# Patient Record
Sex: Male | Born: 2000 | Hispanic: Yes | Marital: Single | Smoking: Never smoker
Health system: Southern US, Community
[De-identification: ages and names within clinical notes are randomized; demographics above are authoritative.]

---
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
## 2019-10-12 DIAGNOSIS — U071 COVID-19: Secondary | ICD-10-CM | POA: Diagnosis not present

## 2019-10-12 DIAGNOSIS — Z20822 Contact with and (suspected) exposure to covid-19: Secondary | ICD-10-CM | POA: Diagnosis not present

## 2019-11-01 DIAGNOSIS — J029 Acute pharyngitis, unspecified: Secondary | ICD-10-CM | POA: Diagnosis not present

## 2020-03-09 DIAGNOSIS — L210 Seborrhea capitis: Secondary | ICD-10-CM | POA: Diagnosis not present

## 2020-03-09 DIAGNOSIS — K9049 Malabsorption due to intolerance, not elsewhere classified: Secondary | ICD-10-CM | POA: Diagnosis not present

## 2020-06-15 ENCOUNTER — Other Ambulatory Visit: Payer: Self-pay

## 2020-06-15 ENCOUNTER — Ambulatory Visit (INDEPENDENT_AMBULATORY_CARE_PROVIDER_SITE_OTHER): Payer: BC Managed Care – PPO | Admitting: Sports Medicine

## 2020-06-15 DIAGNOSIS — M7651 Patellar tendinitis, right knee: Secondary | ICD-10-CM | POA: Insufficient documentation

## 2020-06-15 MED ORDER — MELOXICAM 15 MG PO TABS: ORAL_TABLET | ORAL | 3 refills | Status: DC

## 2020-06-15 NOTE — Progress Notes (Signed)
    Procedures performed today:    None.  Independent interpretation of notes and tests performed by another provider:   None.  Brief History, Exam, Impression, and Recommendations:    Patellar tendinitis, right knee This is a pleasant 21 year old male, he was a track and Location manager, now works in Network engineer, he has noted increasing pain with certain activities at work such as crawling and kneeling, he has started to wear a knee pad which tends to help. On exam he has pain at the distal pole of the patella consistent with patellar tendinitis. Physes are likely close with unlikely this represents Sinding-Larsen-Johansson syndrome. Adding a patellar strap, he will do decline/eccentric rehabilitation exercises in the gym, patellar tendinitis exercises given as well. Meloxicam for 2 weeks. I also asked him to be very cognizant of ergonomics at work. Return to see me in a month, we will discuss advanced imaging and biologic type treatment if no better at the follow-up.    ___________________________________________ Ihor Austin. Benjamin Stain, M.D., ABFM., CAQSM. Primary Care and Sports Medicine [facility]  Adjunct Instructor of Family Medicine  [employer]

## 2020-06-15 NOTE — Assessment & Plan Note (Signed)
This is a pleasant 20 year old male, he was a track and Location manager, now works in Network engineer, he has noted increasing pain with certain activities at work such as crawling and kneeling, he has started to wear a knee pad which tends to help. On exam he has pain at the distal pole of the patella consistent with patellar tendinitis. Physes are likely close with unlikely this represents Sinding-Larsen-Johansson syndrome. Adding a patellar strap, he will do decline/eccentric rehabilitation exercises in the gym, patellar tendinitis exercises given as well. Meloxicam for 2 weeks. I also asked him to be very cognizant of ergonomics at work. Return to see me in a month, we will discuss advanced imaging and biologic type treatment if no better at the follow-up.

## 2020-06-23 ENCOUNTER — Other Ambulatory Visit: Payer: Self-pay

## 2020-06-23 DIAGNOSIS — M7651 Patellar tendinitis, right knee: Secondary | ICD-10-CM

## 2020-06-23 MED ORDER — MELOXICAM 15 MG PO TABS: ORAL_TABLET | ORAL | 3 refills | Status: DC

## 2020-06-23 NOTE — Progress Notes (Signed)
Mother called stating that their new insurance requires them to use [facility]. [facility].

## 2020-07-13 ENCOUNTER — Ambulatory Visit: Payer: BC Managed Care – PPO | Admitting: Sports Medicine

## 2020-08-13 ENCOUNTER — Observation Stay (HOSPITAL_COMMUNITY)
Admission: EM | Admit: 2020-08-13 | Discharge: 2020-08-15 | Disposition: A | Discharge status: Home / Self Care | Payer: BC Managed Care – PPO | Attending: Orthopedic Surgery | Admitting: Orthopedic Surgery

## 2020-08-13 ENCOUNTER — Other Ambulatory Visit: Payer: Self-pay

## 2020-08-13 ENCOUNTER — Encounter (HOSPITAL_COMMUNITY): Payer: Self-pay

## 2020-08-13 DIAGNOSIS — S81811A Laceration without foreign body, right lower leg, initial encounter: Secondary | ICD-10-CM | POA: Diagnosis not present

## 2020-08-13 DIAGNOSIS — S82831A Other fracture of upper and lower end of right fibula, initial encounter for closed fracture: Secondary | ICD-10-CM | POA: Diagnosis not present

## 2020-08-13 DIAGNOSIS — S8991XA Unspecified injury of right lower leg, initial encounter: Secondary | ICD-10-CM | POA: Diagnosis not present

## 2020-08-13 DIAGNOSIS — S82201B Unspecified fracture of shaft of right tibia, initial encounter for open fracture type I or II: Secondary | ICD-10-CM | POA: Diagnosis present

## 2020-08-13 DIAGNOSIS — Z23 Encounter for immunization: Secondary | ICD-10-CM | POA: Diagnosis not present

## 2020-08-13 DIAGNOSIS — S82434B Nondisplaced oblique fracture of shaft of right fibula, initial encounter for open fracture type I or II: Secondary | ICD-10-CM

## 2020-08-13 DIAGNOSIS — S86321A Laceration of muscle(s) and tendon(s) of peroneal muscle group at lower leg level, right leg, initial encounter: Secondary | ICD-10-CM | POA: Insufficient documentation

## 2020-08-13 DIAGNOSIS — S8411XA Injury of peroneal nerve at lower leg level, right leg, initial encounter: Secondary | ICD-10-CM | POA: Diagnosis not present

## 2020-08-13 DIAGNOSIS — S82202B Unspecified fracture of shaft of left tibia, initial encounter for open fracture type I or II: Secondary | ICD-10-CM | POA: Diagnosis present

## 2020-08-13 DIAGNOSIS — S82231B Displaced oblique fracture of shaft of right tibia, initial encounter for open fracture type I or II: Secondary | ICD-10-CM | POA: Diagnosis not present

## 2020-08-13 DIAGNOSIS — W268XXA Contact with other sharp object(s), not elsewhere classified, initial encounter: Secondary | ICD-10-CM | POA: Diagnosis not present

## 2020-08-13 DIAGNOSIS — S82401B Unspecified fracture of shaft of right fibula, initial encounter for open fracture type I or II: Principal | ICD-10-CM | POA: Insufficient documentation

## 2020-08-13 DIAGNOSIS — Z20822 Contact with and (suspected) exposure to covid-19: Secondary | ICD-10-CM | POA: Diagnosis not present

## 2020-08-13 NOTE — ED Triage Notes (Signed)
Pt presents from home, states he was cutting wood with an axe and it slipped and cut his anterior R lower leg. Laceration is approximately 2-3in length

## 2020-08-13 NOTE — ED Notes (Signed)
Wound cleansed and re-dressed. 

## 2020-08-14 ENCOUNTER — Encounter (HOSPITAL_COMMUNITY): Payer: Self-pay

## 2020-08-14 ENCOUNTER — Encounter (HOSPITAL_COMMUNITY)
Admission: EM | Disposition: A | Discharge status: Home / Self Care | Payer: Self-pay | Source: Home / Self Care | Attending: Emergency Medicine

## 2020-08-14 ENCOUNTER — Emergency Department (HOSPITAL_COMMUNITY): Payer: BC Managed Care – PPO | Admitting: Certified Registered Nurse Anesthetist

## 2020-08-14 ENCOUNTER — Emergency Department (HOSPITAL_COMMUNITY): Payer: BC Managed Care – PPO

## 2020-08-14 DIAGNOSIS — S82401B Unspecified fracture of shaft of right fibula, initial encounter for open fracture type I or II: Secondary | ICD-10-CM | POA: Diagnosis not present

## 2020-08-14 DIAGNOSIS — S82201B Unspecified fracture of shaft of right tibia, initial encounter for open fracture type I or II: Secondary | ICD-10-CM | POA: Diagnosis present

## 2020-08-14 DIAGNOSIS — S82831B Other fracture of upper and lower end of right fibula, initial encounter for open fracture type I or II: Secondary | ICD-10-CM | POA: Diagnosis not present

## 2020-08-14 DIAGNOSIS — S82831A Other fracture of upper and lower end of right fibula, initial encounter for closed fracture: Secondary | ICD-10-CM | POA: Diagnosis not present

## 2020-08-14 DIAGNOSIS — M7651 Patellar tendinitis, right knee: Secondary | ICD-10-CM | POA: Diagnosis not present

## 2020-08-14 DIAGNOSIS — S86121A Laceration of other muscle(s) and tendon(s) of posterior muscle group at lower leg level, right leg, initial encounter: Secondary | ICD-10-CM | POA: Diagnosis not present

## 2020-08-14 DIAGNOSIS — S82202B Unspecified fracture of shaft of left tibia, initial encounter for open fracture type I or II: Secondary | ICD-10-CM | POA: Diagnosis present

## 2020-08-14 DIAGNOSIS — S81811A Laceration without foreign body, right lower leg, initial encounter: Secondary | ICD-10-CM | POA: Diagnosis not present

## 2020-08-14 HISTORY — PX: ORIF ANKLE FRACTURE: SHX5408

## 2020-08-14 HISTORY — PX: I & D EXTREMITY: SHX5045

## 2020-08-14 LAB — CBC WITH DIFFERENTIAL/PLATELET
Abs Immature Granulocytes: 0.04 10*3/uL (ref 0.00–0.07)
Basophils Absolute: 0 10*3/uL (ref 0.0–0.1)
Basophils Relative: 0 %
Eosinophils Absolute: 0 10*3/uL (ref 0.0–0.5)
Eosinophils Relative: 0 %
HCT: 43.1 % (ref 39.0–52.0)
Hemoglobin: 15 g/dL (ref 13.0–17.0)
Immature Granulocytes: 0 %
Lymphocytes Relative: 9 %
Lymphs Abs: 1.2 10*3/uL (ref 0.7–4.0)
MCH: 30.9 pg (ref 26.0–34.0)
MCHC: 34.8 g/dL (ref 30.0–36.0)
MCV: 88.9 fL (ref 80.0–100.0)
Monocytes Absolute: 0.8 10*3/uL (ref 0.1–1.0)
Monocytes Relative: 6 %
Neutro Abs: 10.7 10*3/uL — ABNORMAL HIGH (ref 1.7–7.7)
Neutrophils Relative %: 85 %
Platelets: 256 10*3/uL (ref 150–400)
RBC: 4.85 MIL/uL (ref 4.22–5.81)
RDW: 12.7 % (ref 11.5–15.5)
WBC: 12.7 10*3/uL — ABNORMAL HIGH (ref 4.0–10.5)
nRBC: 0 % (ref 0.0–0.2)

## 2020-08-14 LAB — BASIC METABOLIC PANEL
Anion gap: 10 (ref 5–15)
BUN: 21 mg/dL — ABNORMAL HIGH (ref 6–20)
CO2: 26 mmol/L (ref 22–32)
Calcium: 9.2 mg/dL (ref 8.9–10.3)
Chloride: 103 mmol/L (ref 98–111)
Creatinine, Ser: 1.12 mg/dL (ref 0.61–1.24)
GFR, Estimated: >60 mL/min (ref >60)
Glucose, Bld: 128 mg/dL — ABNORMAL HIGH (ref 70–99)
Potassium: 3.8 mmol/L (ref 3.5–5.1)
Sodium: 139 mmol/L (ref 135–145)

## 2020-08-14 LAB — RESP PANEL BY RT-PCR (FLU A&B, COVID) ARPGX2
Influenza A by PCR: NEGATIVE
Influenza B by PCR: NEGATIVE
SARS Coronavirus 2 by RT PCR: NEGATIVE

## 2020-08-14 SURGERY — IRRIGATION AND DEBRIDEMENT EXTREMITY
Anesthesia: General | Site: Ankle | Laterality: Right

## 2020-08-14 MED ORDER — ACETAMINOPHEN 10 MG/ML IV SOLN
INTRAVENOUS | Status: AC
  Filled 2020-08-14: qty 100

## 2020-08-14 MED ORDER — CEFAZOLIN SODIUM-DEXTROSE 2-4 GM/100ML-% IV SOLN
2.0000 g | INTRAVENOUS | Status: AC
  Administered 2020-08-14: 2 g via INTRAVENOUS

## 2020-08-14 MED ORDER — VANCOMYCIN HCL 1000 MG IV SOLR
INTRAVENOUS | Status: DC | PRN
  Administered 2020-08-14: 1000 mg

## 2020-08-14 MED ORDER — POVIDONE-IODINE 10 % EX SWAB
2.0000 "application " | Freq: Once | CUTANEOUS | Status: AC
  Administered 2020-08-14: 2 via TOPICAL

## 2020-08-14 MED ORDER — DOCUSATE SODIUM 100 MG PO CAPS
100.0000 mg | ORAL_CAPSULE | Freq: Two times a day (BID) | ORAL | Status: DC
  Administered 2020-08-14 (×2): 100 mg via ORAL
  Filled 2020-08-14 (×2): qty 1

## 2020-08-14 MED ORDER — LIDOCAINE 2% (20 MG/ML) 5 ML SYRINGE
INTRAMUSCULAR | Status: DC | PRN
  Administered 2020-08-14: 60 mg via INTRAVENOUS

## 2020-08-14 MED ORDER — HYDROCODONE-ACETAMINOPHEN 5-325 MG PO TABS: 1.0000 | ORAL_TABLET | ORAL | Status: DC | PRN

## 2020-08-14 MED ORDER — ACETAMINOPHEN 10 MG/ML IV SOLN
1000.0000 mg | Freq: Once | INTRAVENOUS | Status: DC | PRN
  Administered 2020-08-14: 1000 mg via INTRAVENOUS

## 2020-08-14 MED ORDER — OXYCODONE HCL 5 MG PO TABS: 5.0000 mg | ORAL_TABLET | Freq: Once | ORAL | Status: DC | PRN

## 2020-08-14 MED ORDER — PROPOFOL 10 MG/ML IV BOLUS
INTRAVENOUS | Status: DC | PRN
  Administered 2020-08-14: 200 mg via INTRAVENOUS

## 2020-08-14 MED ORDER — FENTANYL CITRATE (PF) 250 MCG/5ML IJ SOLN
INTRAMUSCULAR | Status: DC | PRN
  Administered 2020-08-14: 150 ug via INTRAVENOUS

## 2020-08-14 MED ORDER — NAPROXEN 250 MG PO TABS
250.0000 mg | ORAL_TABLET | Freq: Two times a day (BID) | ORAL | Status: DC
  Administered 2020-08-14 – 2020-08-15 (×2): 250 mg via ORAL
  Filled 2020-08-14 (×2): qty 1

## 2020-08-14 MED ORDER — KETOROLAC TROMETHAMINE 30 MG/ML IJ SOLN: 30.0000 mg | Freq: Once | INTRAMUSCULAR | Status: AC

## 2020-08-14 MED ORDER — AMISULPRIDE (ANTIEMETIC) 5 MG/2ML IV SOLN: 10.0000 mg | Freq: Once | INTRAVENOUS | Status: DC | PRN

## 2020-08-14 MED ORDER — SODIUM CHLORIDE 0.9 % IV SOLN: INTRAVENOUS | Status: DC

## 2020-08-14 MED ORDER — DEXAMETHASONE SODIUM PHOSPHATE 10 MG/ML IJ SOLN
INTRAMUSCULAR | Status: DC | PRN
  Administered 2020-08-14: 8 mg via INTRAVENOUS

## 2020-08-14 MED ORDER — BISACODYL 10 MG RE SUPP: 10.0000 mg | Freq: Every day | RECTAL | Status: DC | PRN

## 2020-08-14 MED ORDER — ONDANSETRON HCL 4 MG/2ML IJ SOLN
INTRAMUSCULAR | Status: DC | PRN
  Administered 2020-08-14: 4 mg via INTRAVENOUS

## 2020-08-14 MED ORDER — SENNA 8.6 MG PO TABS
1.0000 | ORAL_TABLET | Freq: Two times a day (BID) | ORAL | Status: DC
  Administered 2020-08-14 (×2): 8.6 mg via ORAL
  Filled 2020-08-14 (×2): qty 1

## 2020-08-14 MED ORDER — KETOROLAC TROMETHAMINE 30 MG/ML IJ SOLN
INTRAMUSCULAR | Status: AC
  Administered 2020-08-14: 30 mg via INTRAVENOUS
  Filled 2020-08-14: qty 1

## 2020-08-14 MED ORDER — MIDAZOLAM HCL 5 MG/5ML IJ SOLN
INTRAMUSCULAR | Status: DC | PRN
  Administered 2020-08-14: 2 mg via INTRAVENOUS

## 2020-08-14 MED ORDER — MORPHINE SULFATE (PF) 2 MG/ML IV SOLN
0.5000 mg | INTRAVENOUS | Status: DC | PRN
  Administered 2020-08-14 – 2020-08-15 (×3): 1 mg via INTRAVENOUS
  Filled 2020-08-14 (×3): qty 1

## 2020-08-14 MED ORDER — CEFAZOLIN SODIUM-DEXTROSE 2-4 GM/100ML-% IV SOLN
INTRAVENOUS | Status: AC
  Filled 2020-08-14: qty 100

## 2020-08-14 MED ORDER — MAGNESIUM CITRATE PO SOLN: 1.0000 | Freq: Once | ORAL | Status: DC | PRN

## 2020-08-14 MED ORDER — HYDROMORPHONE HCL 1 MG/ML IJ SOLN
0.5000 mg | Freq: Once | INTRAMUSCULAR | Status: AC | PRN
  Administered 2020-08-14: 0.5 mg via INTRAVENOUS

## 2020-08-14 MED ORDER — ARTIFICIAL TEARS OPHTHALMIC OINT
TOPICAL_OINTMENT | OPHTHALMIC | Status: AC
  Filled 2020-08-14: qty 3.5

## 2020-08-14 MED ORDER — CEFAZOLIN SODIUM-DEXTROSE 1-4 GM/50ML-% IV SOLN
1.0000 g | Freq: Once | INTRAVENOUS | Status: AC
  Administered 2020-08-14: 1 g via INTRAVENOUS
  Filled 2020-08-14: qty 50

## 2020-08-14 MED ORDER — FENTANYL CITRATE (PF) 100 MCG/2ML IJ SOLN
INTRAMUSCULAR | Status: DC | PRN
  Administered 2020-08-14 (×3): 50 ug via INTRAVENOUS

## 2020-08-14 MED ORDER — PROPOFOL 10 MG/ML IV BOLUS
INTRAVENOUS | Status: AC
  Filled 2020-08-14: qty 20

## 2020-08-14 MED ORDER — ONDANSETRON HCL 4 MG/2ML IJ SOLN: 4.0000 mg | Freq: Four times a day (QID) | INTRAMUSCULAR | Status: DC | PRN

## 2020-08-14 MED ORDER — FENTANYL CITRATE (PF) 100 MCG/2ML IJ SOLN
25.0000 ug | INTRAMUSCULAR | Status: DC | PRN
  Administered 2020-08-14 (×2): 50 ug via INTRAVENOUS

## 2020-08-14 MED ORDER — STERILE WATER FOR INJECTION IJ SOLN
INTRAMUSCULAR | Status: AC
  Filled 2020-08-14: qty 20

## 2020-08-14 MED ORDER — ENOXAPARIN SODIUM 40 MG/0.4ML IJ SOSY
40.0000 mg | PREFILLED_SYRINGE | INTRAMUSCULAR | Status: DC
  Administered 2020-08-15: 40 mg via SUBCUTANEOUS
  Filled 2020-08-14: qty 0.4

## 2020-08-14 MED ORDER — POLYETHYLENE GLYCOL 3350 17 G PO PACK: 17.0000 g | PACK | Freq: Every day | ORAL | Status: DC | PRN

## 2020-08-14 MED ORDER — SUCCINYLCHOLINE CHLORIDE 20 MG/ML IJ SOLN
INTRAMUSCULAR | Status: DC | PRN
  Administered 2020-08-14: 60 mg via INTRAVENOUS

## 2020-08-14 MED ORDER — DEXAMETHASONE SODIUM PHOSPHATE 10 MG/ML IJ SOLN
INTRAMUSCULAR | Status: AC
  Filled 2020-08-14: qty 1

## 2020-08-14 MED ORDER — TETANUS-DIPHTH-ACELL PERTUSSIS 5-2.5-18.5 LF-MCG/0.5 IM SUSY
0.5000 mL | PREFILLED_SYRINGE | Freq: Once | INTRAMUSCULAR | Status: AC
  Administered 2020-08-14: 0.5 mL via INTRAMUSCULAR
  Filled 2020-08-14: qty 0.5

## 2020-08-14 MED ORDER — CHLORHEXIDINE GLUCONATE 4 % EX LIQD: 60.0000 mL | Freq: Once | CUTANEOUS | Status: DC

## 2020-08-14 MED ORDER — FENTANYL CITRATE (PF) 250 MCG/5ML IJ SOLN
INTRAMUSCULAR | Status: AC
  Filled 2020-08-14: qty 5

## 2020-08-14 MED ORDER — HYDROMORPHONE HCL 1 MG/ML IJ SOLN
INTRAMUSCULAR | Status: AC
  Filled 2020-08-14: qty 1

## 2020-08-14 MED ORDER — HYDROCODONE-ACETAMINOPHEN 7.5-325 MG PO TABS
1.0000 | ORAL_TABLET | ORAL | Status: DC | PRN
  Administered 2020-08-14 (×2): 1 via ORAL
  Administered 2020-08-14 – 2020-08-15 (×3): 2 via ORAL
  Filled 2020-08-14: qty 1
  Filled 2020-08-14: qty 2
  Filled 2020-08-14: qty 1
  Filled 2020-08-14 (×2): qty 2

## 2020-08-14 MED ORDER — LACTATED RINGERS IV SOLN: INTRAVENOUS | Status: DC | PRN

## 2020-08-14 MED ORDER — SODIUM CHLORIDE (PF) 0.9 % IJ SOLN
INTRAMUSCULAR | Status: AC
  Filled 2020-08-14: qty 50

## 2020-08-14 MED ORDER — LIDOCAINE 2% (20 MG/ML) 5 ML SYRINGE
INTRAMUSCULAR | Status: AC
  Filled 2020-08-14: qty 5

## 2020-08-14 MED ORDER — FENTANYL CITRATE (PF) 100 MCG/2ML IJ SOLN
100.0000 ug | Freq: Once | INTRAMUSCULAR | Status: AC
  Administered 2020-08-14: 100 ug via INTRAVENOUS
  Filled 2020-08-14: qty 2

## 2020-08-14 MED ORDER — ARTIFICIAL TEARS OPHTHALMIC OINT
TOPICAL_OINTMENT | OPHTHALMIC | Status: DC | PRN
  Administered 2020-08-14: 1 via OPHTHALMIC

## 2020-08-14 MED ORDER — SODIUM CHLORIDE 0.9 % IR SOLN
Status: DC | PRN
  Administered 2020-08-14: 3000 mL

## 2020-08-14 MED ORDER — VANCOMYCIN HCL 1000 MG IV SOLR
INTRAVENOUS | Status: AC
  Filled 2020-08-14: qty 1000

## 2020-08-14 MED ORDER — ONDANSETRON HCL 4 MG/2ML IJ SOLN
4.0000 mg | Freq: Once | INTRAMUSCULAR | Status: AC
  Administered 2020-08-14: 4 mg via INTRAVENOUS
  Filled 2020-08-14: qty 2

## 2020-08-14 MED ORDER — IOHEXOL 350 MG/ML SOLN
100.0000 mL | Freq: Once | INTRAVENOUS | Status: AC | PRN
  Administered 2020-08-14: 100 mL via INTRAVENOUS

## 2020-08-14 MED ORDER — MIDAZOLAM HCL 2 MG/2ML IJ SOLN
INTRAMUSCULAR | Status: AC
  Filled 2020-08-14: qty 2

## 2020-08-14 MED ORDER — FENTANYL CITRATE (PF) 100 MCG/2ML IJ SOLN
INTRAMUSCULAR | Status: AC
  Filled 2020-08-14: qty 2

## 2020-08-14 MED ORDER — ONDANSETRON HCL 4 MG PO TABS: 4.0000 mg | ORAL_TABLET | Freq: Four times a day (QID) | ORAL | Status: DC | PRN

## 2020-08-14 MED ORDER — CEFAZOLIN SODIUM-DEXTROSE 2-4 GM/100ML-% IV SOLN
2.0000 g | Freq: Three times a day (TID) | INTRAVENOUS | Status: AC
  Administered 2020-08-14 – 2020-08-15 (×3): 2 g via INTRAVENOUS
  Filled 2020-08-14 (×3): qty 100

## 2020-08-14 MED ORDER — OXYCODONE HCL 5 MG/5ML PO SOLN: 5.0000 mg | Freq: Once | ORAL | Status: DC | PRN

## 2020-08-14 MED ORDER — PROMETHAZINE HCL 25 MG/ML IJ SOLN: 6.2500 mg | INTRAMUSCULAR | Status: DC | PRN

## 2020-08-14 MED ORDER — FENTANYL CITRATE (PF) 100 MCG/2ML IJ SOLN
INTRAMUSCULAR | Status: AC
  Administered 2020-08-14: 50 ug via INTRAVENOUS
  Filled 2020-08-14: qty 2

## 2020-08-14 MED ORDER — ACETAMINOPHEN 325 MG PO TABS
325.0000 mg | ORAL_TABLET | Freq: Four times a day (QID) | ORAL | Status: DC | PRN
Start: 2020-08-15 — End: 2020-08-15

## 2020-08-14 SURGICAL SUPPLY — 75 items
ANCHOR SUT KEITH ABD SZ2 STR (SUTURE) IMPLANT
BAG ZIPLOCK 12X15 (MISCELLANEOUS) ×3 IMPLANT
BNDG COHESIVE 4X5 TAN STRL (GAUZE/BANDAGES/DRESSINGS) ×3 IMPLANT
BNDG COHESIVE 6X5 TAN STRL LF (GAUZE/BANDAGES/DRESSINGS) ×3 IMPLANT
BNDG CONFORM 3 STRL LF (GAUZE/BANDAGES/DRESSINGS) ×3 IMPLANT
BNDG ELASTIC 4X5.8 VLCR STR LF (GAUZE/BANDAGES/DRESSINGS) ×3 IMPLANT
BNDG ELASTIC 6X5.8 VLCR STR LF (GAUZE/BANDAGES/DRESSINGS) ×3 IMPLANT
BNDG ESMARK 4X9 LF (GAUZE/BANDAGES/DRESSINGS) ×3 IMPLANT
BNDG GAUZE ELAST 4 BULKY (GAUZE/BANDAGES/DRESSINGS) ×6 IMPLANT
COVER SURGICAL LIGHT HANDLE (MISCELLANEOUS) ×3 IMPLANT
COVER WAND RF STERILE (DRAPES) IMPLANT
CUFF TOURN SGL QUICK 34 (TOURNIQUET CUFF) ×1
CUFF TRNQT CYL 34X4.125X (TOURNIQUET CUFF) ×2 IMPLANT
DECANTER SPIKE VIAL GLASS SM (MISCELLANEOUS) ×3 IMPLANT
DRAPE C-ARM 42X120 X-RAY (DRAPES) IMPLANT
DRAPE EXTREMITY T 121X128X90 (DISPOSABLE) ×3 IMPLANT
DRAPE OEC MINIVIEW 54X84 (DRAPES) ×3 IMPLANT
DRAPE ORTHO SPLIT 77X108 STRL (DRAPES) ×2
DRAPE SHEET LG 3/4 BI-LAMINATE (DRAPES) ×3 IMPLANT
DRAPE SURG ORHT 6 SPLT 77X108 (DRAPES) ×4 IMPLANT
DRAPE U-SHAPE 47X51 STRL (DRAPES) ×3 IMPLANT
DRESSING AQUACEL AG SP 3.5X6 (GAUZE/BANDAGES/DRESSINGS) ×2 IMPLANT
DRSG ADAPTIC 3X8 NADH LF (GAUZE/BANDAGES/DRESSINGS) IMPLANT
DRSG AQUACEL AG SP 3.5X6 (GAUZE/BANDAGES/DRESSINGS) ×3
DRSG PAD ABDOMINAL 8X10 ST (GAUZE/BANDAGES/DRESSINGS) ×3 IMPLANT
DURAPREP 26ML APPLICATOR (WOUND CARE) ×3 IMPLANT
ELECT REM PT RETURN 15FT ADLT (MISCELLANEOUS) ×3 IMPLANT
FACESHIELD WRAPAROUND (MASK) ×3 IMPLANT
GAUZE PACKING IODOFORM 1X5 (PACKING) ×3 IMPLANT
GAUZE SPONGE 4X4 12PLY STRL (GAUZE/BANDAGES/DRESSINGS) ×3 IMPLANT
GLOVE SRG 8 PF TXTR STRL LF DI (GLOVE) ×2 IMPLANT
GLOVE SURG ENC MOIS LTX SZ8 (GLOVE) ×3 IMPLANT
GLOVE SURG LTX SZ8 (GLOVE) ×6 IMPLANT
GLOVE SURG ORTHO LTX SZ7.5 (GLOVE) ×3 IMPLANT
GLOVE SURG ORTHO LTX SZ8.5 (GLOVE) ×3 IMPLANT
GLOVE SURG UNDER LTX SZ8 (GLOVE) ×3 IMPLANT
GLOVE SURG UNDER POLY LF SZ8 (GLOVE) ×1
KIT BASIN OR (CUSTOM PROCEDURE TRAY) ×3 IMPLANT
KIT TURNOVER KIT A (KITS) ×3 IMPLANT
NEEDLE HYPO 22GX1.5 SAFETY (NEEDLE) ×3 IMPLANT
NEEDLE MAYO CATGUT SZ4 (NEEDLE) IMPLANT
NS IRRIG 1000ML POUR BTL (IV SOLUTION) ×3 IMPLANT
PACK ORTHO EXTREMITY (CUSTOM PROCEDURE TRAY) ×3 IMPLANT
PACK TOTAL JOINT (CUSTOM PROCEDURE TRAY) ×3 IMPLANT
PAD CAST 4YDX4 CTTN HI CHSV (CAST SUPPLIES) ×4 IMPLANT
PADDING CAST COTTON 4X4 STRL (CAST SUPPLIES) ×2
PADDING CAST COTTON 6X4 STRL (CAST SUPPLIES) ×3 IMPLANT
PADDING CAST SYN 6 (CAST SUPPLIES) ×1
PADDING CAST SYNTHETIC 4 (CAST SUPPLIES) ×1
PADDING CAST SYNTHETIC 4X4 STR (CAST SUPPLIES) ×2 IMPLANT
PADDING CAST SYNTHETIC 6X4 NS (CAST SUPPLIES) ×2 IMPLANT
PENCIL SMOKE EVACUATOR (MISCELLANEOUS) ×3 IMPLANT
PROTECTOR NERVE ULNAR (MISCELLANEOUS) ×3 IMPLANT
SPLINT PLASTER CAST XFAST 5X30 (CAST SUPPLIES) ×2 IMPLANT
SPLINT PLASTER XFAST SET 5X30 (CAST SUPPLIES) ×1
SPONGE LAP 18X18 RF (DISPOSABLE) ×3 IMPLANT
SPONGE LAP 4X18 RFD (DISPOSABLE) ×6 IMPLANT
STAPLER VISISTAT 35W (STAPLE) ×3 IMPLANT
STRIP CLOSURE SKIN 1/2X4 (GAUZE/BANDAGES/DRESSINGS) ×3 IMPLANT
SUCTION FRAZIER HANDLE 10FR (MISCELLANEOUS) ×1
SUCTION TUBE FRAZIER 10FR DISP (MISCELLANEOUS) ×2 IMPLANT
SUT ETHILON 2 0 PS N (SUTURE) ×9 IMPLANT
SUT MNCRL AB 4-0 PS2 18 (SUTURE) ×6 IMPLANT
SUT PROLENE 3 0 PS 2 (SUTURE) ×3 IMPLANT
SUT PROLENE 6 0 P 1 18 (SUTURE) ×3 IMPLANT
SUT VIC AB 0 CT1 27 (SUTURE) ×1
SUT VIC AB 0 CT1 27XBRD ANTBC (SUTURE) ×2 IMPLANT
SUT VIC AB 2-0 CT1 27 (SUTURE) ×2
SUT VIC AB 2-0 CT1 TAPERPNT 27 (SUTURE) ×4 IMPLANT
SUT VIC AB 3-0 PS2 18 (SUTURE) ×1
SUT VIC AB 3-0 PS2 18XBRD (SUTURE) ×2 IMPLANT
SWAB COLLECTION DEVICE MRSA (MISCELLANEOUS) IMPLANT
SWAB CULTURE ESWAB REG 1ML (MISCELLANEOUS) IMPLANT
SYR CONTROL 10ML LL (SYRINGE) ×3 IMPLANT
WATER STERILE IRR 1000ML POUR (IV SOLUTION) ×3 IMPLANT

## 2020-08-14 NOTE — ED Notes (Signed)
To OR

## 2020-08-14 NOTE — Anesthesia Procedure Notes (Signed)
Procedure Name: Intubation Date/Time: 08/14/2020 5:11 AM Performed by: Montel Clock, CRNA Pre-anesthesia Checklist: Patient identified, Emergency Drugs available, Suction available, Patient being monitored and Timeout performed Patient Re-evaluated:Patient Re-evaluated prior to induction Oxygen Delivery Method: Circle system utilized Preoxygenation: Pre-oxygenation with 100% oxygen Induction Type: IV induction and Rapid sequence Laryngoscope Size: Mac and 3 Grade View: Grade I Tube type: Oral Tube size: 7.5 mm Number of attempts: 1 Airway Equipment and Method: Stylet Placement Confirmation: ETT inserted through vocal cords under direct vision,  positive ETCO2 and breath sounds checked- equal and bilateral Secured at: 23 cm Tube secured with: Tape Dental Injury: Teeth and Oropharynx as per pre-operative assessment

## 2020-08-14 NOTE — ED Notes (Signed)
LBM: 1400, 08/13/20 Last urine void: 2330, 08/13/20 Last PO intake: 2000, 08/13/20

## 2020-08-14 NOTE — ED Provider Notes (Signed)
[facility]-EMERGENCY DEPT Provider Note: Lowella Dell, MD, FACEP  CSN: [id number] MRN: [id number] ARRIVAL: 08/13/20 at 2225 ROOM: WA10/WA10   CHIEF COMPLAINT  Extremity Laceration   HISTORY OF PRESENT ILLNESS  08/14/20 12:32 AM Ryan Hall is a 20 y.o. male who was cutting wood with an ax about 2 and half hours ago and slipped and cut his lateral right lower leg.  He has a gaping laceration at the site.  He is having pain at the site which he rates as an 8 out of 10, worse with attempted movement or palpation.  He has decreased sensation and paresthesias of his foot but not complete loss of sensation.  He is having difficulty moving his right ankle but he states he believes this is due to pain.  He states there was significant blood loss at the scene.  The wound is now hemostatic.   History reviewed. No pertinent past medical history.  History reviewed. No pertinent surgical history.  History reviewed. No pertinent family history.  Social History   Tobacco Use  . Smoking status: Never Smoker  . Smokeless tobacco: Never Used    Prior to Admission medications   Not on File    Allergies Patient has no known allergies.   REVIEW OF SYSTEMS  Negative except as noted here or in the History of Present Illness.   PHYSICAL EXAMINATION  Initial Vital Signs Blood pressure 135/76, pulse 70, temperature 98.4 F (36.9 C), temperature source Oral, resp. rate 17, height 5\' 7"  (1.702 m), weight 68 kg, SpO2 100 %.  Examination General: Well-developed, well-nourished male in no acute distress; appearance consistent with age of record HENT: normocephalic; atraumatic Eyes: pupils equal, round and reactive to light; extraocular muscles intact Neck: supple Heart: regular rate and rhythm Lungs: clear to auscultation bilaterally Abdomen: soft; nondistended; nontender; bowel sounds present Extremities: No deformity; full range of motion; DP and PT pulses +1 bilaterally; laceration of  right lateral lower leg, motor function of right foot intact, motor function of right ankle exam limited due to pain, altered sensation of right foot without complete insensitivity, capillary refill brisk distally:    Neurologic: Awake, alert and oriented; motor function intact in all extremities and symmetric; no facial droop Skin: Warm and dry Psychiatric: Normal mood and affect   RESULTS  Summary of this visit's results, reviewed and interpreted by myself:   EKG Interpretation  Date/Time:    Ventricular Rate:    PR Interval:    QRS Duration:   QT Interval:    QTC Calculation:   R Axis:     Text Interpretation:        Laboratory Studies: Results for orders placed or performed during the hospital encounter of 08/13/20 (from the past 24 hour(s))  CBC with Differential/Platelet     Status: Abnormal   Collection Time: 08/14/20 12:57 AM  Result Value Ref Range   WBC 12.7 (H) 4.0 - 10.5 K/uL   RBC 4.85 4.22 - 5.81 MIL/uL   Hemoglobin 15.0 13.0 - 17.0 g/dL   HCT 08/16/20 54.0 - 08.6 %   MCV 88.9 80.0 - 100.0 fL   MCH 30.9 26.0 - 34.0 pg   MCHC 34.8 30.0 - 36.0 g/dL   RDW 76.1 95.0 - 93.2 %   Platelets 256 150 - 400 K/uL   nRBC 0.0 0.0 - 0.2 %   Neutrophils Relative % 85 %   Neutro Abs 10.7 (H) 1.7 - 7.7 K/uL   Lymphocytes Relative 9 %  Lymphs Abs 1.2 0.7 - 4.0 K/uL   Monocytes Relative 6 %   Monocytes Absolute 0.8 0.1 - 1.0 K/uL   Eosinophils Relative 0 %   Eosinophils Absolute 0.0 0.0 - 0.5 K/uL   Basophils Relative 0 %   Basophils Absolute 0.0 0.0 - 0.1 K/uL   Immature Granulocytes 0 %   Abs Immature Granulocytes 0.04 0.00 - 0.07 K/uL  Basic metabolic panel     Status: Abnormal   Collection Time: 08/14/20 12:57 AM  Result Value Ref Range   Sodium 139 135 - 145 mmol/L   Potassium 3.8 3.5 - 5.1 mmol/L   Chloride 103 98 - 111 mmol/L   CO2 26 22 - 32 mmol/L   Glucose, Bld 128 (H) 70 - 99 mg/dL   BUN 21 (H) 6 - 20 mg/dL   Creatinine, Ser 3.01 0.61 - 1.24 mg/dL    Calcium 9.2 8.9 - 60.1 mg/dL   GFR, Estimated >09 >32 mL/min   Anion gap 10 5 - 15   Imaging Studies: CT ANGIO LOW EXTREM RIGHT W &/OR WO CONTRAST  Result Date: 08/14/2020 CLINICAL DATA:  Right lower extremity penetrating injury. EXAM: CT ANGIOGRAPHY OF THE RIGHT LOWEREXTREMITY TECHNIQUE: Multidetector CT imaging of the right lowerwas performed using the standard protocol during bolus administration of intravenous contrast. Multiplanar CT image reconstructions and MIPs were obtained to evaluate the vascular anatomy. CONTRAST:  OMNIPAQUE IOHEXOL 350 MG/ML SOLN COMPARISON:  None. FINDINGS: The visualized popliteal artery, proximal anterior tibial artery, tibioperoneal trunk, peroneal artery, and posterior tibial arteries are widely patent without evidence of hemodynamically significant stenosis, aneurysm, or dissection. No significant atherosclerotic plaque. The anterior tibial artery tapers gradually and terminates at the level of the distal third of the diaphysis of the tibia. The vessel, however, appears separate from the adjacent soft tissue wound and this may simply relate to vasospasm related to the rate Gilchrist injury. Similarly, the peroneal artery at the level of the injury is remote from the wound, but demonstrates gradual tapering and ultimately terminates at the level of the tibial metaphysis again, likely related to vasospasm. The posterior tibial artery continues to form the plantar arch. No active extravasation identified. No pseudoaneurysm or traumatic arteriovenous fistula. A soft tissue defect is seen involving the lateral aspect of the right foreleg at the level of the distal third of the diaphysis of the tibia and fibula. There is a cortical fracture fragment involving the lateral cortex of the distal fibula which communicates with the wound in keeping with an open fracture. The fracture fragment involves less than 50% of the a circumference of the fibula. Subcutaneous gas is seen within  the soft tissues surrounding the wound. Review of the MIP images confirms the above findings. IMPRESSION: No definite traumatic vascular injury. Gradual occlusion of the anterior tibial and to a lesser extent peroneal arteries likely relates to vasospasm as both vessels are remote from the soft tissue wound. No active extravasation, pseudoaneurysm, or traumatic AV fistula. Open fracture of the distal fibular diaphysis with small lateral cortical fracture fragment identified. Fracture fragment comprises less than 50% of the circumference of the fibula. Large lateral soft tissue defect at the level of the distal third of the fibular diaphysis communicating with the fracture plane at its base. Electronically Signed   By: Helyn Numbers MD   On: 08/14/2020 02:14    ED COURSE and MDM  Nursing notes, initial and subsequent vitals signs, including pulse oximetry, reviewed and interpreted by myself.  Vitals:  08/13/20 2235 08/14/20 0015 08/14/20 0115 08/14/20 0200  BP: 110/90 135/76 115/64 (!) 100/56  Pulse: 68 70 71 84  Resp: 18 17 16 17   Temp: 98.4 F (36.9 C)     TempSrc: Oral     SpO2: 100% 100% 99% 100%  Weight: 68 kg     Height: 5\' 7"  (1.702 m)      Medications  sodium chloride (PF) 0.9 % injection (  Not Given 08/14/20 0149)  ceFAZolin (ANCEF) IVPB 1 g/50 mL premix (1 g Intravenous New Bag/Given 08/14/20 0314)  Tdap (BOOSTRIX) injection 0.5 mL (has no administration in time range)  ceFAZolin (ANCEF) IVPB 1 g/50 mL premix (0 g Intravenous Stopped 08/14/20 0155)  ondansetron (ZOFRAN) injection 4 mg (4 mg Intravenous Given 08/14/20 0058)  fentaNYL (SUBLIMAZE) injection 100 mcg (100 mcg Intravenous Given 08/14/20 0058)  iohexol (OMNIPAQUE) 350 MG/ML injection 100 mL (100 mLs Intravenous Contrast Given 08/14/20 0123)  fentaNYL (SUBLIMAZE) injection 100 mcg (100 mcg Intravenous Given 08/14/20 0314)   2:39 AM Ancef 2 g total IV given for open fracture.  Tdap given.  3:09 AM Dr. 08/16/20 of  orthopedics consulted.  He will take the patient to the operating room later this morning.  Patient kept n.p.o.   PROCEDURES  Procedures CRITICAL CARE Performed by: 08/16/20 Davanta Meuser Total critical care time: 30 minutes Critical care time was exclusive of separately billable procedures and treating other patients. Critical care was necessary to treat or prevent imminent or life-threatening deterioration. Critical care was time spent personally by me on the following activities: development of treatment plan with patient and/or surrogate as well as nursing, discussions with consultants, evaluation of patient's response to treatment, examination of patient, obtaining history from patient or surrogate, ordering and performing treatments and interventions, ordering and review of laboratory studies, ordering and review of radiographic studies, pulse oximetry and re-evaluation of patient's condition.   ED DIAGNOSES     ICD-10-CM   1. Laceration of right lower leg with complication, initial encounter  S81.811A   2. Type I or II open nondisplaced oblique fracture of shaft of right fibula, initial encounter  S82.434B        Anda Sobotta, Victorino Dike, MD 08/14/20 0310    Jonny Ruiz, MD 08/14/20 [phone]

## 2020-08-14 NOTE — Plan of Care (Signed)
Plan of care discussed with pt.

## 2020-08-14 NOTE — Evaluation (Signed)
Physical Therapy Evaluation Patient Details Name: Ryan Hall MRN: [id number] DOB: 20-Feb-2001 Today's Date: 08/14/2020   History of Present Illness  Pt is 20 yo male admitted late on 08/13/20 after chopping wood with axe and hitting R lower leg found to have R fibula fx, laceration of peroneus brevis and longus tendon, laceration of peroneal nerve.  Pt s/p ORIF fibula , I and D, and repair of tendons and nerve on 08/14/20. Only history is R patellar tendonitis.  Clinical Impression   Pt admitted with above diagnosis.  He is very pleasant and motivated 20 yo with good pain control.  Pt normally independent, active, and physically fit.  During PT evaluation, pt with good intuition on movement and crutch use requiring minimal cueing.  He was able to ambulate 200' and performed stairs with crutches.  Good adherence to NWB status. Pt has family support.  Will need crutches for d/c but no other therapy needs. Pt motivated to return to driving and work - discussed would likely be several weeks but he would need to discuss with Careers adviser.     Follow Up Recommendations Other (comment) (no initial therapy  - potential outpt in future when ordered by surgeon)    Equipment Recommendations  Crutches    Recommendations for Other Services       Precautions / Restrictions Precautions Precautions: Fall Required Braces or Orthoses: Splint/Cast Splint/Cast: R ankle in splint Restrictions Weight Bearing Restrictions: Yes RLE Weight Bearing: Non weight bearing      Mobility  Bed Mobility Overal bed mobility: Independent                  Transfers Overall transfer level: Modified independent Equipment used: Crutches Transfers: Sit to/from Stand Sit to Stand: Supervision         General transfer comment: Had supervision but demonstrated safely without cues  Ambulation/Gait Ambulation/Gait assistance: Min Emergency planning/management officer (Feet): 200 Feet Assistive device: Crutches        General Gait Details: Pt able to ambulate with crutches and L foot, good adhearence to NWB on R LE, initial cues to take his time and be safe as pt starting with fast ambulation but was steady and progressed to safe speed and supervision  Stairs Stairs: Yes Stairs assistance: Min guard Stair Management: With crutches;Step to pattern Number of Stairs: 7 General stair comments: Up/down 7 stairs with crutches with minimal cues.  Did cue on  safe position of R LE (knee flexed up and leg out front down).  Demonstrated safely and easily.  Also , discussed could do crutches and 1 rail or even sit and scoot up in house.  Wheelchair Mobility    Modified Rankin (Stroke Patients Only)       Balance Overall balance assessment: Modified Independent   Sitting balance-Leahy Scale: Normal     Standing balance support: No upper extremity supported;Bilateral upper extremity supported Standing balance-Leahy Scale: Good Standing balance comment: Utilized crutches for ambulation but able to stand on L leg to position crutches                             Pertinent Vitals/Pain Pain Assessment: 0-10 Pain Score: 4  (4/10 pre, 6/10 post therapy) Pain Location: R foot Pain Descriptors / Indicators: Discomfort;Sore Pain Intervention(s): Limited activity within patient's tolerance;Monitored during session;Patient requesting pain meds-RN notified    Home Living Family/patient expects to be discharged to:: Private residence Living Arrangements: Parent Available Help at  Discharge: Family;Available PRN/intermittently (reports can be available 24 hr first couple days) Type of Home: House Home Access: Level entry     Home Layout: Two level;Bed/bath upstairs;Able to live on main level with bedroom/bathroom Home Equipment: None      Prior Function Level of Independence: Independent         Comments: Works for Production designer, theatre/television/film         Extremity/Trunk Assessment   Upper Extremity Assessment Upper Extremity Assessment: Overall WFL for tasks assessed    Lower Extremity Assessment Lower Extremity Assessment: RLE deficits/detail RLE Deficits / Details: Able to flex/extend toes, ankle in splint, hip and knee ROM WNL ; MMT : at least 3/5 hip and knee not further tested due to sx    Cervical / Trunk Assessment Cervical / Trunk Assessment: Normal  Communication   Communication: No difficulties  Cognition Arousal/Alertness: Awake/alert Behavior During Therapy: WFL for tasks assessed/performed Overall Cognitive Status: Within Functional Limits for tasks assessed                                 General Comments: Extremely pleasant and motivated; some impulsivity requiring education on safety      General Comments  Educated on crutch use and provided handout on stairs.  Pt asking about return to driving and work -discussed would likely be several weeks but surgeon would provide guidelines.  Discussed demonstrated safe mobility with crutches and could up with supervision while in hospital.  Recommended 24 hr supervision for 1-2 days at home but could likely quickly progress to intermittent supervision. Also, educated on importance of ice and elevation for lower leg injuries to prevent/limit edema and prevent compartment syndrome.     Exercises  Educated on knee and hip ROM as able - pt completing without difficulty   Assessment/Plan    PT Assessment Patent does not need any further PT services  PT Problem List         PT Treatment Interventions      PT Goals (Current goals can be found in the Care Plan section)  Acute Rehab PT Goals Patient Stated Goal: return home and back to work ASAP PT Goal Formulation: All assessment and education complete, DC therapy    Frequency     Barriers to discharge        Co-evaluation               AM-PAC PT "6 Clicks" Mobility  Outcome Measure Help needed  turning from your back to your side while in a flat bed without using bedrails?: None Help needed moving from lying on your back to sitting on the side of a flat bed without using bedrails?: None Help needed moving to and from a bed to a chair (including a wheelchair)?: None Help needed standing up from a chair using your arms (e.g., wheelchair or bedside chair)?: None Help needed to walk in hospital room?: A Little Help needed climbing 3-5 steps with a railing? : A Little 6 Click Score: 22    End of Session Equipment Utilized During Treatment: Gait belt Activity Tolerance: Patient tolerated treatment well Patient left: in bed;with call bell/phone within reach;with family/visitor present Nurse Communication: Mobility status;Weight bearing status;Other (comment) (safe to ambulate with family and crutches)      Time: 7322-0254 PT Time Calculation (min) (ACUTE ONLY): 20 min   Charges:   PT Evaluation $  PT Eval Low Complexity: 1 Low          Gregg Holster, PT Acute Rehab Services Pager [phone] [facility] [phone]    Rayetta Humphrey 08/14/2020, 12:23 PM

## 2020-08-14 NOTE — ED Notes (Signed)
Back from CT

## 2020-08-14 NOTE — Op Note (Signed)
08/14/2020  6:25 AM  PATIENT:  Ryan Hall  20 y.o. male  PRE-OPERATIVE DIAGNOSIS:  Right open fibula fracture  POST-OPERATIVE DIAGNOSIS: 1.  Open right fibula fracture      2.  Right leg laceration 7 cm x 5 cm      3.  Laceration of peroneus longus tendon      4.  Laceration of peroneus brevis tendon      5.  Laceration of the superficial peroneal nerve  Procedure(s): 1.  Irrigation and excisional debridement of right open fibula fracture including skin, subcutaneous tissue, muscle and bone 2.  Open treatment of right fibula fracture with internal fixation 3.  Repair of peroneus brevis tendon 4.  Repair of peroneus longus tendon 5.  Repair of superficial peroneal nerve 6.  Complex closure of right leg laceration 7 cm in length  SURGEON:  Toni Arthurs, MD  ASSISTANT: None  ANESTHESIA:   General  EBL:  minimal   TOURNIQUET: Less than 1 hour at 250 mmHg  COMPLICATIONS:  None apparent  DISPOSITION:  Extubated, awake and stable to recovery.  INDICATION FOR PROCEDURE: The patient is a 20 year old male without significant past medical history.  He was splitting wood with an ax earlier this evening.  He reports that the blade of the ax exited the wood and hit his right leg.  He presented to the emergency room at [facility] where he initially had a dose of antibiotics and a CT scan to evaluate for arterial injury.  CT revealed a fibular fracture and no evidence of arterial injury.  He presents now for surgical treatment of this open fracture.  The risks and benefits of the alternative treatment options have been discussed in detail.  The patient wishes to proceed with surgery and specifically understands risks of bleeding, infection, nerve damage, blood clots, need for additional surgery, amputation and death.  PROCEDURE IN DETAIL: After preoperative consent was obtained and the correct operative site was identified, the patient was brought the operating room and placed upon  the operating table.  General anesthesia was induced.  Preoperative antibiotics were administered.  A surgical timeout was taken.  The right lower extremity was prepped and draped in standard sterile fashion with a tourniquet around the thigh.  The extremity was exsanguinated and the tourniquet was inflated to 250 mmHg.  The laceration was identified at the anterolateral aspect of the distal right leg.  It was mostly transverse with a flap based proximally.  The laceration measured from anterior to posterior 7 cm and from distal to proximal 5 cm.  The skin edges were quite clean without tearing or bruising.  The superficial fascia over the anterior and lateral compartments was open.  The fibular fracture was identified.  There was some comminution with 2 fragments of bone displaced laterally.  Both had good periosteal attachment.  The peroneus longus and peroneus brevis tendons were both lacerated completely.  The superficial peroneal nerve was also noted to be completely transected.  Circumferential debridement was then performed with scissors and a curette from the level of the skin down through the subcutaneous tissues muscle and bone.  All nonviable tissue was removed.  A few small flakes of dirt were removed.  The wound was then irrigated copiously with 3 L of normal saline.  The peroneus brevis tendon was then repaired with figure-of-eight and horizontal mattress sutures of 0 Vicryl.  The peroneus longus tendon was repaired in the same fashion.  The fibular fracture was repaired  by reducing the fracture fragments and suturing the periosteum over top of the fractures as internal fixation.  The superficial peroneal nerve was then gently released from the subcutaneous tissues for a few centimeters proximally and distally.  Careful neurolysis was performed at each stump.  6-0 Prolene perineurial stitches were placed circumferentially across the laceration site repairing the nerve ends in an end-to-end fashion.   The wound was again irrigated copiously and sprinkled with vancomycin powder.  Retention sutures and corner sutures were placed first.  The skin edges were repaired with horizontal mattress sutures of 3-0 nylon.  The retention sutures were then removed.  Sterile dressings were applied followed by a well-padded short leg splint.  The tourniquet was released after application of the dressings.  The patient was awakened from anesthesia and transported to the recovery room in stable condition.   FOLLOW UP PLAN: Nonweightbearing on the right lower extremity.  Observation for IV antibiotics.  Follow-up in the office in 2 weeks for suture removal.

## 2020-08-14 NOTE — Transfer of Care (Signed)
Immediate Anesthesia Transfer of Care Note  Patient: Ryan Hall  Procedure(s) Performed: IRRIGATION AND DEBRIDEMENT EXTREMITY (Right ) OPEN REDUCTION INTERNAL FIXATION (ORIF) ANKLE FRACTURE (Right Ankle)  Patient Location: PACU  Anesthesia Type:General  Level of Consciousness: awake, alert  and oriented  Airway & Oxygen Therapy: Patient Spontanous Breathing and Patient connected to face mask oxygen  Post-op Assessment: Report given to RN and Post -op Vital signs reviewed and stable  Post vital signs: Reviewed and stable  Last Vitals:  Vitals Value Taken Time  BP    Temp    Pulse 96 08/14/20 0637  Resp 22 08/14/20 0637  SpO2 100 % 08/14/20 0637  Vitals shown include unvalidated device data.  Last Pain:  Vitals:   08/14/20 0402  TempSrc:   PainSc: 7          Complications: No complications documented.

## 2020-08-14 NOTE — ED Provider Notes (Addendum)
[facility]-EMERGENCY DEPT Provider Note: Lowella Dell, MD, FACEP  CSN: [id number] MRN: [id number] ARRIVAL: 08/13/20 at 2225 ROOM: WA10/WA10   CHIEF COMPLAINT  Extremity Laceration   HISTORY OF PRESENT ILLNESS  08/14/20 12:32 AM Ryan Hall is a 20 y.o. male who was cutting wood with an ax about 2 and half hours ago and slipped and cut his lateral right lower leg.  He has a gaping laceration at the site.  He is having pain at the site which he rates as an 8 out of 10, worse with attempted movement or palpation.  He has decreased sensation and paresthesias of his foot but not complete loss of sensation.  He is having difficulty moving his right ankle but he states he believes this is due to pain.  He states there was significant blood loss at the scene.  The wound is now hemostatic.   History reviewed. No pertinent past medical history.  History reviewed. No pertinent surgical history.  History reviewed. No pertinent family history.  Social History   Tobacco Use  . Smoking status: Never Smoker  . Smokeless tobacco: Never Used    Prior to Admission medications   Not on File    Allergies Patient has no known allergies.   REVIEW OF SYSTEMS  Negative except as noted here or in the History of Present Illness.   PHYSICAL EXAMINATION  Initial Vital Signs Blood pressure 135/76, pulse 70, temperature 98.4 F (36.9 C), temperature source Oral, resp. rate 17, height 5\' 7"  (1.702 m), weight 68 kg, SpO2 100 %.  Examination General: Well-developed, well-nourished male in no acute distress; appearance consistent with age of record HENT: normocephalic; atraumatic Eyes: pupils equal, round and reactive to light; extraocular muscles intact Neck: supple Heart: regular rate and rhythm Lungs: clear to auscultation bilaterally Abdomen: soft; nondistended; nontender; bowel sounds present Extremities: No deformity; full range of motion; DP and PT pulses +1 bilaterally; laceration of  right lateral lower leg, motor function of right foot intact, examination of motor function of right ankle exam limited due to pain but visual inspection of wound is suspicious for tendon laceration, altered sensation of right foot without complete insensitivity, capillary refill brisk distally:    Neurologic: Awake, alert and oriented; motor function intact in all extremities and symmetric; no facial droop Skin: Warm and dry Psychiatric: Normal mood and affect   RESULTS  Summary of this visit's results, reviewed and interpreted by myself:   EKG Interpretation  Date/Time:    Ventricular Rate:    PR Interval:    QRS Duration:   QT Interval:    QTC Calculation:   R Axis:     Text Interpretation:        Laboratory Studies: Results for orders placed or performed during the hospital encounter of 08/13/20 (from the past 24 hour(s))  CBC with Differential/Platelet     Status: Abnormal   Collection Time: 08/14/20 12:57 AM  Result Value Ref Range   WBC 12.7 (H) 4.0 - 10.5 K/uL   RBC 4.85 4.22 - 5.81 MIL/uL   Hemoglobin 15.0 13.0 - 17.0 g/dL   HCT 08/16/20 14.9 - 70.2 %   MCV 88.9 80.0 - 100.0 fL   MCH 30.9 26.0 - 34.0 pg   MCHC 34.8 30.0 - 36.0 g/dL   RDW 63.7 85.8 - 85.0 %   Platelets 256 150 - 400 K/uL   nRBC 0.0 0.0 - 0.2 %   Neutrophils Relative % 85 %   Neutro Abs 10.7 (H)  1.7 - 7.7 K/uL   Lymphocytes Relative 9 %   Lymphs Abs 1.2 0.7 - 4.0 K/uL   Monocytes Relative 6 %   Monocytes Absolute 0.8 0.1 - 1.0 K/uL   Eosinophils Relative 0 %   Eosinophils Absolute 0.0 0.0 - 0.5 K/uL   Basophils Relative 0 %   Basophils Absolute 0.0 0.0 - 0.1 K/uL   Immature Granulocytes 0 %   Abs Immature Granulocytes 0.04 0.00 - 0.07 K/uL  Basic metabolic panel     Status: Abnormal   Collection Time: 08/14/20 12:57 AM  Result Value Ref Range   Sodium 139 135 - 145 mmol/L   Potassium 3.8 3.5 - 5.1 mmol/L   Chloride 103 98 - 111 mmol/L   CO2 26 22 - 32 mmol/L   Glucose, Bld 128 (H) 70 - 99  mg/dL   BUN 21 (H) 6 - 20 mg/dL   Creatinine, Ser 5.99 0.61 - 1.24 mg/dL   Calcium 9.2 8.9 - 35.7 mg/dL   GFR, Estimated >01 >77 mL/min   Anion gap 10 5 - 15  Resp Panel by RT-PCR (Flu A&B, Covid) Nasopharyngeal Swab     Status: None   Collection Time: 08/14/20  2:49 AM   Specimen: Nasopharyngeal Swab; Nasopharyngeal(NP) swabs in vial transport medium  Result Value Ref Range   SARS Coronavirus 2 by RT PCR NEGATIVE NEGATIVE   Influenza A by PCR NEGATIVE NEGATIVE   Influenza B by PCR NEGATIVE NEGATIVE   Imaging Studies: CT ANGIO LOW EXTREM RIGHT W &/OR WO CONTRAST  Result Date: 08/14/2020 CLINICAL DATA:  Right lower extremity penetrating injury. EXAM: CT ANGIOGRAPHY OF THE RIGHT LOWEREXTREMITY TECHNIQUE: Multidetector CT imaging of the right lowerwas performed using the standard protocol during bolus administration of intravenous contrast. Multiplanar CT image reconstructions and MIPs were obtained to evaluate the vascular anatomy. CONTRAST:  OMNIPAQUE IOHEXOL 350 MG/ML SOLN COMPARISON:  None. FINDINGS: The visualized popliteal artery, proximal anterior tibial artery, tibioperoneal trunk, peroneal artery, and posterior tibial arteries are widely patent without evidence of hemodynamically significant stenosis, aneurysm, or dissection. No significant atherosclerotic plaque. The anterior tibial artery tapers gradually and terminates at the level of the distal third of the diaphysis of the tibia. The vessel, however, appears separate from the adjacent soft tissue wound and this may simply relate to vasospasm related to the rate [place] injury. Similarly, the peroneal artery at the level of the injury is remote from the wound, but demonstrates gradual tapering and ultimately terminates at the level of the tibial metaphysis again, likely related to vasospasm. The posterior tibial artery continues to form the plantar arch. No active extravasation identified. No pseudoaneurysm or traumatic arteriovenous  fistula. A soft tissue defect is seen involving the lateral aspect of the right foreleg at the level of the distal third of the diaphysis of the tibia and fibula. There is a cortical fracture fragment involving the lateral cortex of the distal fibula which communicates with the wound in keeping with an open fracture. The fracture fragment involves less than 50% of the a circumference of the fibula. Subcutaneous gas is seen within the soft tissues surrounding the wound. Review of the MIP images confirms the above findings. IMPRESSION: No definite traumatic vascular injury. Gradual occlusion of the anterior tibial and to a lesser extent peroneal arteries likely relates to vasospasm as both vessels are remote from the soft tissue wound. No active extravasation, pseudoaneurysm, or traumatic AV fistula. Open fracture of the distal fibular diaphysis with small lateral cortical  fracture fragment identified. Fracture fragment comprises less than 50% of the circumference of the fibula. Large lateral soft tissue defect at the level of the distal third of the fibular diaphysis communicating with the fracture plane at its base. Electronically Signed   By: Helyn Numbers MD   On: 08/14/2020 02:14    ED COURSE and MDM  Nursing notes, initial and subsequent vitals signs, including pulse oximetry, reviewed and interpreted by myself.  Vitals:   08/14/20 0115 08/14/20 0200 08/14/20 0315 08/14/20 0330  BP: 115/64 (!) 100/56 98/60 121/74  Pulse: 71 84 77 84  Resp: 16 17 18    Temp:      TempSrc:      SpO2: 99% 100% 98% 98%  Weight:      Height:       Medications  sodium chloride (PF) 0.9 % injection (  Not Given 08/14/20 0149)  ceFAZolin (ANCEF) IVPB 1 g/50 mL premix (0 g Intravenous Stopped 08/14/20 0155)  ondansetron (ZOFRAN) injection 4 mg (4 mg Intravenous Given 08/14/20 0058)  fentaNYL (SUBLIMAZE) injection 100 mcg (100 mcg Intravenous Given 08/14/20 0058)  iohexol (OMNIPAQUE) 350 MG/ML injection 100 mL (100 mLs  Intravenous Contrast Given 08/14/20 0123)  ceFAZolin (ANCEF) IVPB 1 g/50 mL premix (1 g Intravenous New Bag/Given 08/14/20 0314)  fentaNYL (SUBLIMAZE) injection 100 mcg (100 mcg Intravenous Given 08/14/20 0314)  Tdap (BOOSTRIX) injection 0.5 mL (0.5 mLs Intramuscular Given 08/14/20 0333)   2:39 AM Ancef 2 g total IV given for open fracture.  Tdap given.  3:09 AM Dr. 08/16/20 of orthopedics consulted.  He will take the patient to the operating room later this morning.  Patient kept n.p.o.   PROCEDURES  Procedures CRITICAL CARE Performed by: Victorino Dike Crystle Carelli Total critical care time: 30 minutes Critical care time was exclusive of separately billable procedures and treating other patients. Critical care was necessary to treat or prevent imminent or life-threatening deterioration. Critical care was time spent personally by me on the following activities: development of treatment plan with patient and/or surrogate as well as nursing, discussions with consultants, evaluation of patient's response to treatment, examination of patient, obtaining history from patient or surrogate, ordering and performing treatments and interventions, ordering and review of laboratory studies, ordering and review of radiographic studies, pulse oximetry and re-evaluation of patient's condition.   ED DIAGNOSES     ICD-10-CM   1. Laceration of right lower leg with complication, initial encounter  S81.811A   2. Type I or II open nondisplaced oblique fracture of shaft of right fibula, initial encounter  S82.434B        Carter Kassel, Carlisle Beers, MD 08/14/20 0310    08/16/20, MD 08/14/20 0357    Kynzi Levay, 08/16/20, MD 08/14/20 [phone]

## 2020-08-14 NOTE — ED Notes (Signed)
Ryan Hall, mom can be reached at [phone]. Patient is in gown and ready for transport.

## 2020-08-14 NOTE — ED Provider Notes (Signed)
[facility] DEPT Provider Note: Lowella Dell, MD, FACEP  CSN: [id number] MRN: [id number] ARRIVAL: 08/13/20 at 2225 ROOM: WA10/WA10   CHIEF COMPLAINT  Extremity Laceration   HISTORY OF PRESENT ILLNESS  08/14/20 12:32 AM Ryan Hall is a 20 y.o. male who was cutting wood with an ax about 2 and half hours ago and slipped and cut his lateral right lower leg.  He has a gaping laceration at the site.  He is having pain at the site which he rates as an 8 out of 10, worse with attempted movement or palpation.  He has decreased sensation and paresthesias of his foot but not complete loss of sensation.  He is having difficulty moving his right ankle but he states he believes this is due to pain.  He states there was significant blood loss at the scene.  The wound is now hemostatic.   History reviewed. No pertinent past medical history.  History reviewed. No pertinent surgical history.  History reviewed. No pertinent family history.  Social History   Tobacco Use  . Smoking status: Never Smoker  . Smokeless tobacco: Never Used    Prior to Admission medications   Not on File    Allergies Patient has no known allergies.   REVIEW OF SYSTEMS  Negative except as noted here or in the History of Present Illness.   PHYSICAL EXAMINATION  Initial Vital Signs Blood pressure 135/76, pulse 70, temperature 98.4 F (36.9 C), temperature source Oral, resp. rate 17, height 5\' 7"  (1.702 m), weight 68 kg, SpO2 100 %.  Examination General: Well-developed, well-nourished male in no acute distress; appearance consistent with age of record HENT: normocephalic; atraumatic Eyes: pupils equal, round and reactive to light; extraocular muscles intact Neck: supple Heart: regular rate and rhythm Lungs: clear to auscultation bilaterally Abdomen: soft; nondistended; nontender; bowel sounds present Extremities: No deformity; full range of motion; DP and PT pulses +1 bilaterally; laceration of  right lateral lower leg, motor function of right foot intact, motor function of right ankle exam limited due to pain, altered sensation of right foot without complete insensitivity, capillary refill brisk distally:    Neurologic: Awake, alert and oriented; motor function intact in all extremities and symmetric; no facial droop Skin: Warm and dry Psychiatric: Normal mood and affect   RESULTS  Summary of this visit's results, reviewed and interpreted by myself:   EKG Interpretation  Date/Time:    Ventricular Rate:    PR Interval:    QRS Duration:   QT Interval:    QTC Calculation:   R Axis:     Text Interpretation:        Laboratory Studies: Results for orders placed or performed during the hospital encounter of 08/13/20 (from the past 24 hour(s))  CBC with Differential/Platelet     Status: Abnormal   Collection Time: 08/14/20 12:57 AM  Result Value Ref Range   WBC 12.7 (H) 4.0 - 10.5 K/uL   RBC 4.85 4.22 - 5.81 MIL/uL   Hemoglobin 15.0 13.0 - 17.0 g/dL   HCT 08/16/20 54.0 - 08.6 %   MCV 88.9 80.0 - 100.0 fL   MCH 30.9 26.0 - 34.0 pg   MCHC 34.8 30.0 - 36.0 g/dL   RDW 76.1 95.0 - 93.2 %   Platelets 256 150 - 400 K/uL   nRBC 0.0 0.0 - 0.2 %   Neutrophils Relative % 85 %   Neutro Abs 10.7 (H) 1.7 - 7.7 K/uL   Lymphocytes Relative 9 %  Lymphs Abs 1.2 0.7 - 4.0 K/uL   Monocytes Relative 6 %   Monocytes Absolute 0.8 0.1 - 1.0 K/uL   Eosinophils Relative 0 %   Eosinophils Absolute 0.0 0.0 - 0.5 K/uL   Basophils Relative 0 %   Basophils Absolute 0.0 0.0 - 0.1 K/uL   Immature Granulocytes 0 %   Abs Immature Granulocytes 0.04 0.00 - 0.07 K/uL  Basic metabolic panel     Status: Abnormal   Collection Time: 08/14/20 12:57 AM  Result Value Ref Range   Sodium 139 135 - 145 mmol/L   Potassium 3.8 3.5 - 5.1 mmol/L   Chloride 103 98 - 111 mmol/L   CO2 26 22 - 32 mmol/L   Glucose, Bld 128 (H) 70 - 99 mg/dL   BUN 21 (H) 6 - 20 mg/dL   Creatinine, Ser 6.57 0.61 - 1.24 mg/dL    Calcium 9.2 8.9 - 84.6 mg/dL   GFR, Estimated >96 >29 mL/min   Anion gap 10 5 - 15   Imaging Studies: CT ANGIO LOW EXTREM RIGHT W &/OR WO CONTRAST  Result Date: 08/14/2020 CLINICAL DATA:  Right lower extremity penetrating injury. EXAM: CT ANGIOGRAPHY OF THE RIGHT LOWEREXTREMITY TECHNIQUE: Multidetector CT imaging of the right lowerwas performed using the standard protocol during bolus administration of intravenous contrast. Multiplanar CT image reconstructions and MIPs were obtained to evaluate the vascular anatomy. CONTRAST:  OMNIPAQUE IOHEXOL 350 MG/ML SOLN COMPARISON:  None. FINDINGS: The visualized popliteal artery, proximal anterior tibial artery, tibioperoneal trunk, peroneal artery, and posterior tibial arteries are widely patent without evidence of hemodynamically significant stenosis, aneurysm, or dissection. No significant atherosclerotic plaque. The anterior tibial artery tapers gradually and terminates at the level of the distal third of the diaphysis of the tibia. The vessel, however, appears separate from the adjacent soft tissue wound and this may simply relate to vasospasm related to the rate [place] injury. Similarly, the peroneal artery at the level of the injury is remote from the wound, but demonstrates gradual tapering and ultimately terminates at the level of the tibial metaphysis again, likely related to vasospasm. The posterior tibial artery continues to form the plantar arch. No active extravasation identified. No pseudoaneurysm or traumatic arteriovenous fistula. A soft tissue defect is seen involving the lateral aspect of the right foreleg at the level of the distal third of the diaphysis of the tibia and fibula. There is a cortical fracture fragment involving the lateral cortex of the distal fibula which communicates with the wound in keeping with an open fracture. The fracture fragment involves less than 50% of the a circumference of the fibula. Subcutaneous gas is seen within  the soft tissues surrounding the wound. Review of the MIP images confirms the above findings. IMPRESSION: No definite traumatic vascular injury. Gradual occlusion of the anterior tibial and to a lesser extent peroneal arteries likely relates to vasospasm as both vessels are remote from the soft tissue wound. No active extravasation, pseudoaneurysm, or traumatic AV fistula. Open fracture of the distal fibular diaphysis with small lateral cortical fracture fragment identified. Fracture fragment comprises less than 50% of the circumference of the fibula. Large lateral soft tissue defect at the level of the distal third of the fibular diaphysis communicating with the fracture plane at its base. Electronically Signed   By: Helyn Numbers MD   On: 08/14/2020 02:14    ED COURSE and MDM  Nursing notes, initial and subsequent vitals signs, including pulse oximetry, reviewed and interpreted by myself.  Vitals:  08/13/20 2235 08/14/20 0015 08/14/20 0115 08/14/20 0200  BP: 110/90 135/76 115/64 (!) 100/56  Pulse: 68 70 71 84  Resp: 18 17 16 17   Temp: 98.4 F (36.9 C)     TempSrc: Oral     SpO2: 100% 100% 99% 100%  Weight: 68 kg     Height: 5\' 7"  (1.702 m)      Medications  sodium chloride (PF) 0.9 % injection (  Not Given 08/14/20 0149)  ceFAZolin (ANCEF) IVPB 1 g/50 mL premix (has no administration in time range)  fentaNYL (SUBLIMAZE) injection 100 mcg (has no administration in time range)  ceFAZolin (ANCEF) IVPB 1 g/50 mL premix (0 g Intravenous Stopped 08/14/20 0155)  ondansetron (ZOFRAN) injection 4 mg (4 mg Intravenous Given 08/14/20 0058)  fentaNYL (SUBLIMAZE) injection 100 mcg (100 mcg Intravenous Given 08/14/20 0058)  iohexol (OMNIPAQUE) 350 MG/ML injection 100 mL (100 mLs Intravenous Contrast Given 08/14/20 0123)   2:39 AM Ancef 2 g total IV given for open fracture.  3:09 AM Dr. 08/16/20 of orthopedics consulted.  He will take the patient to the operating room.   PROCEDURES   Procedures   ED DIAGNOSES     ICD-10-CM   1. Laceration of right lower leg with complication, initial encounter  S81.811A   2. Type I or II open nondisplaced oblique fracture of shaft of right fibula, initial encounter  S82.434B        Cadin Luka, 08/16/20, MD 08/14/20 Jonny Ruiz

## 2020-08-14 NOTE — ED Notes (Signed)
Wound was irrigated with normal saline. Patient states he has hardly no pain in the wound bed, but a burning pain on the top of the foot.

## 2020-08-14 NOTE — H&P (Signed)
Ryan Hall is an 20 y.o. male.   Chief Complaint: right ankle pain HPI: 20 y/o male without PMH was chopping wood with an axe earlier this evening.  He hit his right lower leg with the axe sustaining a laceration.  He presented to the ER at [facility] long for treatment.  He c/o pain in the leg.  He also c/o some numbness in the dorsal foot.  He denies any h/o injury to the leg previously.  He last ate at 8 pm.    PMH:  R patellar tendonitis.  History reviewed. No pertinent surgical history.  History reviewed. No pertinent family history. Social History:  reports that he has never smoked. He has never used smokeless tobacco. No history on file for alcohol use and drug use.  Allergies: No Known Allergies  (Not in a hospital admission)   Results for orders placed or performed during the hospital encounter of 08/13/20 (from the past 48 hour(s))  CBC with Differential/Platelet     Status: Abnormal   Collection Time: 08/14/20 12:57 AM  Result Value Ref Range   WBC 12.7 (H) 4.0 - 10.5 K/uL   RBC 4.85 4.22 - 5.81 MIL/uL   Hemoglobin 15.0 13.0 - 17.0 g/dL   HCT 73.2 20.2 - 54.2 %   MCV 88.9 80.0 - 100.0 fL   MCH 30.9 26.0 - 34.0 pg   MCHC 34.8 30.0 - 36.0 g/dL   RDW 70.6 23.7 - 62.8 %   Platelets 256 150 - 400 K/uL   nRBC 0.0 0.0 - 0.2 %   Neutrophils Relative % 85 %   Neutro Abs 10.7 (H) 1.7 - 7.7 K/uL   Lymphocytes Relative 9 %   Lymphs Abs 1.2 0.7 - 4.0 K/uL   Monocytes Relative 6 %   Monocytes Absolute 0.8 0.1 - 1.0 K/uL   Eosinophils Relative 0 %   Eosinophils Absolute 0.0 0.0 - 0.5 K/uL   Basophils Relative 0 %   Basophils Absolute 0.0 0.0 - 0.1 K/uL   Immature Granulocytes 0 %   Abs Immature Granulocytes 0.04 0.00 - 0.07 K/uL    Comment: Performed at [facility], [address] [place], [place] [address]  Basic metabolic panel     Status: Abnormal   Collection Time: 08/14/20 12:57 AM  Result Value Ref Range   Sodium 139 135 - 145 mmol/L   Potassium  3.8 3.5 - 5.1 mmol/L   Chloride 103 98 - 111 mmol/L   CO2 26 22 - 32 mmol/L   Glucose, Bld 128 (H) 70 - 99 mg/dL    Comment: Glucose reference range applies only to samples taken after fasting for at least 8 hours.   BUN 21 (H) 6 - 20 mg/dL   Creatinine, Ser 6.16 0.61 - 1.24 mg/dL   Calcium 9.2 8.9 - 07.3 mg/dL   GFR, Estimated >71 >06 mL/min    Comment: (NOTE) Calculated using the CKD-EPI Creatinine Equation (2021)    Anion gap 10 5 - 15    Comment: Performed at [facility], [address], [place], [place] [address]   CT ANGIO LOW EXTREM RIGHT W &/OR WO CONTRAST  Result Date: 08/14/2020 CLINICAL DATA:  Right lower extremity penetrating injury. EXAM: CT ANGIOGRAPHY OF THE RIGHT LOWEREXTREMITY TECHNIQUE: Multidetector CT imaging of the right lowerwas performed using the standard protocol during bolus administration of intravenous contrast. Multiplanar CT image reconstructions and MIPs were obtained to evaluate the vascular anatomy. CONTRAST:  OMNIPAQUE IOHEXOL 350  MG/ML SOLN COMPARISON:  None. FINDINGS: The visualized popliteal artery, proximal anterior tibial artery, tibioperoneal trunk, peroneal artery, and posterior tibial arteries are widely patent without evidence of hemodynamically significant stenosis, aneurysm, or dissection. No significant atherosclerotic plaque. The anterior tibial artery tapers gradually and terminates at the level of the distal third of the diaphysis of the tibia. The vessel, however, appears separate from the adjacent soft tissue wound and this may simply relate to vasospasm related to the rate [place] injury. Similarly, the peroneal artery at the level of the injury is remote from the wound, but demonstrates gradual tapering and ultimately terminates at the level of the tibial metaphysis again, likely related to vasospasm. The posterior tibial artery continues to form the plantar arch. No active extravasation identified. No pseudoaneurysm or  traumatic arteriovenous fistula. A soft tissue defect is seen involving the lateral aspect of the right foreleg at the level of the distal third of the diaphysis of the tibia and fibula. There is a cortical fracture fragment involving the lateral cortex of the distal fibula which communicates with the wound in keeping with an open fracture. The fracture fragment involves less than 50% of the a circumference of the fibula. Subcutaneous gas is seen within the soft tissues surrounding the wound. Review of the MIP images confirms the above findings. IMPRESSION: No definite traumatic vascular injury. Gradual occlusion of the anterior tibial and to a lesser extent peroneal arteries likely relates to vasospasm as both vessels are remote from the soft tissue wound. No active extravasation, pseudoaneurysm, or traumatic AV fistula. Open fracture of the distal fibular diaphysis with small lateral cortical fracture fragment identified. Fracture fragment comprises less than 50% of the circumference of the fibula. Large lateral soft tissue defect at the level of the distal third of the fibular diaphysis communicating with the fracture plane at its base. Electronically Signed   By: Helyn Numbers MD   On: Aug 22, 2020 02:14    Review of Systems  No recent f/c/n/v/wt loss.  10 system review o/w neg.  Blood pressure (!) 100/56, pulse 84, temperature 98.4 F (36.9 C), temperature source Oral, resp. rate 17, height 5\' 7"  (1.702 m), weight 68 kg, SpO2 100 %. Physical Exam  wn wd male in nad.  A and O x 4.  Normal mood and affect.  EOMI.  resp unlabored.  R leg with oblique 7 cm laceration of the anterolateral leg at the middle and distal thirds.  Brisk cap refill at the toes.  Diminished sens to LT in the deep and superficial peroneal n dist.  No gross contamination.  Active PF and DF of the ankle and toes with strength limited by pain.  Assessment/Plan Open right fibula fracture - to the OR today for I and D and possible open  treatment of the fracture with possible nerve and / or tendon repair.  The risks and benefits of the alternative treatment options have been discussed in detail.  The patient wishes to proceed with surgery and specifically understands risks of bleeding, infection, nerve damage, blood clots, need for additional surgery, amputation and death.   , MD 08/22/2020, 3:04 AM

## 2020-08-14 NOTE — Discharge Instructions (Addendum)
John Hewitt, MD [facility]  Please read the following information regarding your care after surgery.  Medications  You only need a prescription for the narcotic pain medicine (ex. oxycodone, Percocet, Norco).  All of the other medicines listed below are available over the counter. X Aleve 2 pills twice a day for the first 3 days after surgery. X acetominophen (Tylenol) 650 mg every 4-6 hours as you need for minor to moderate pain X oxycodone as prescribed for severe pain  Narcotic pain medicine (ex. oxycodone, Percocet, Vicodin) will cause constipation.  To prevent this problem, take the following medicines while you are taking any pain medicine. X docusate sodium (Colace) 100 mg twice a day X senna (Senokot) 2 tablets twice a day  X To help prevent blood clots, take a baby aspirin (81 mg) twice a day for two weeks after surgery.  You should also get up every hour while you are awake to move around.    Weight Bearing X Do not bear any weight on the operated leg or foot.  Cast / Splint / Dressing X Keep your splint, cast or dressing clean and dry.  Don't put anything (coat hanger, pencil, etc) down inside of it.  If it gets damp, use a hair dryer on the cool setting to dry it.  If it gets soaked, call the office to schedule an appointment for a cast change.   After your dressing, cast or splint is removed; you may shower, but do not soak or scrub the wound.  Allow the water to run over it, and then gently pat it dry.  Swelling It is normal for you to have swelling where you had surgery.  To reduce swelling and pain, keep your toes above your nose for at least 3 days after surgery.  It may be necessary to keep your foot or leg elevated for several weeks.  If it hurts, it should be elevated.  Follow Up Call my office at [phone] when you are discharged from the hospital or surgery center to schedule an appointment to be seen two weeks after surgery.  Call my office at [phone] if  you develop a fever >101.5 F, nausea, vomiting, bleeding from the surgical site or severe pain.     

## 2020-08-14 NOTE — Anesthesia Preprocedure Evaluation (Addendum)
Anesthesia Evaluation  Patient identified by MRN, date of birth, ID band Patient awake    Reviewed: Allergy & Precautions, NPO status , Patient's Chart, lab work & pertinent test results  Airway Mallampati: I  TM Distance: >3 FB Neck ROM: Full    Dental no notable dental hx.    Pulmonary Current Smoker and Patient abstained from smoking.,    Pulmonary exam normal breath sounds clear to auscultation       Cardiovascular negative cardio ROS Normal cardiovascular exam Rhythm:Regular Rate:Normal     Neuro/Psych negative neurological ROS  negative psych ROS   GI/Hepatic negative GI ROS, Neg liver ROS,   Endo/Other  negative endocrine ROS  Renal/GU negative Renal ROS     Musculoskeletal  (+) Arthritis ,   Abdominal   Peds  Hematology negative hematology ROS (+)   Anesthesia Other Findings Right open fibula fracture  Reproductive/Obstetrics                            Anesthesia Physical Anesthesia Plan  ASA: II and emergent  Anesthesia Plan: General   Post-op Pain Management:    Induction: Intravenous  PONV Risk Score and Plan: 2 and Ondansetron, Dexamethasone, Midazolam and Treatment may vary due to age or medical condition  Airway Management Planned: Oral ETT  Additional Equipment:   Intra-op Plan:   Post-operative Plan: Extubation in OR  Informed Consent: I have reviewed the patients History and Physical, chart, labs and discussed the procedure including the risks, benefits and alternatives for the proposed anesthesia with the patient or authorized representative who has indicated his/her understanding and acceptance.     Dental advisory given  Plan Discussed with: CRNA  Anesthesia Plan Comments:        Anesthesia Quick Evaluation

## 2020-08-15 MED ORDER — OXYCODONE HCL 5 MG PO TABS: 5.0000 mg | ORAL_TABLET | ORAL | 0 refills | Status: AC | PRN

## 2020-08-15 MED ORDER — ASPIRIN EC 81 MG PO TBEC: 81.0000 mg | DELAYED_RELEASE_TABLET | Freq: Two times a day (BID) | ORAL | 0 refills | Status: AC

## 2020-08-15 MED ORDER — DOCUSATE SODIUM 100 MG PO CAPS: 100.0000 mg | ORAL_CAPSULE | Freq: Two times a day (BID) | ORAL | 0 refills | Status: AC

## 2020-08-15 MED ORDER — SENNA 8.6 MG PO TABS: 2.0000 | ORAL_TABLET | Freq: Two times a day (BID) | ORAL | 0 refills | Status: AC

## 2020-08-15 NOTE — Progress Notes (Signed)
Orthopedic Tech Progress Note Patient Details:  Ryan Hall 12-13-2000 [id number]  Ortho Devices Type of Ortho Device: Ace wrap,Post (short leg) splint,Stirrup splint Ortho Device/Splint Location: right Ortho Device/Splint Interventions: Application   Post Interventions Patient Tolerated: Well Instructions Provided: Care of device   Saul Fordyce 08/15/2020, 8:36 AM

## 2020-08-15 NOTE — Anesthesia Postprocedure Evaluation (Signed)
Anesthesia Post Note  Patient: Ryan Hall  Procedure(s) Performed: Irrigation and excisional debridement of right open fibula fracture including skin, subcutaneous tissue, muscle and bone,  Open treatment of right fibula fracture with internal fixation,  Repair of peroneus brevis tendon,  Repair of peroneus longus tendon,   Repair of superficial peroneal nerve 6.  Complex closure of right leg laceration 7 cm in length  (Right ) OPEN REDUCTION INTERNAL FIXATION (ORIF) ANKLE FRACTURE (Right Ankle)     Patient location during evaluation: PACU Anesthesia Type: General Level of consciousness: awake Pain management: pain level controlled Vital Signs Assessment: post-procedure vital signs reviewed and stable Respiratory status: spontaneous breathing, nonlabored ventilation, respiratory function stable and patient connected to nasal cannula oxygen Cardiovascular status: blood pressure returned to baseline and stable Postop Assessment: no apparent nausea or vomiting Anesthetic complications: no   No complications documented.  Last Vitals:  Vitals:   08/15/20 0145 08/15/20 0516  BP: 135/78 (!) 103/49  Pulse: (!) 52 (!) 45  Resp: 16 16  Temp: 36.5 C (!) 36.3 C  SpO2: 99% 100%    Last Pain:  Vitals:   08/15/20 0516  TempSrc: Oral  PainSc:                  Catheryn Bacon Danitza Schoenfeldt

## 2020-08-15 NOTE — Plan of Care (Signed)
Plan of care discussed with pt.

## 2020-08-15 NOTE — Progress Notes (Signed)
Subjective: 1 Day Post-Op Procedure(s) (LRB): Irrigation and excisional debridement of right open fibula fracture including skin, subcutaneous tissue, muscle and bone,  Open treatment of right fibula fracture with internal fixation,  Repair of peroneus brevis tendon,  Repair of peroneus longus tendon,   Repair of superficial peroneal nerve 6.  Complex closure of right leg laceration 7 cm in length  (Right) OPEN REDUCTION INTERNAL FIXATION (ORIF) ANKLE FRACTURE (Right)  Patient reports pain as mild to moderate.  Resting in bed.  Reports that the splint is too tight and that he has had to cut away part of the splint near his toes.  He's requesting a new splint.  Denies fever, chills, N/V, CP, SOB.  Tolerating POs.  Admits to flatus.  Notes that he is ready to go home today.  Objective:   VITALS:  Temp:  [97.4 F (36.3 C)-99 F (37.2 C)] 97.4 F (36.3 C) (05/24 0516) Pulse Rate:  [45-77] 45 (05/24 0516) Resp:  [9-20] 16 (05/24 0516) BP: (102-135)/(47-80) 103/49 (05/24 0516) SpO2:  [95 %-100 %] 100 % (05/24 0516)  General: WDWN patient in NAD. Psych:  Appropriate mood and affect. Neuro:  A&O x 3, Moving all extremities, sensation intact to light touch HEENT:  EOMs intact Chest:  Even non-labored respirations Skin:  SLS C/D/I, no rashes or lesions Extremities: warm/dry, no visible edema, erythema or echymosis.  No lymphadenopathy. Pulses: Popliteus 2+ MSK:  ROM: EHL/FHL intact, MMT: able to perform quad set    LABS Recent Labs    08/14/20 0057  HGB 15.0  WBC 12.7*  PLT 256   Recent Labs    08/14/20 0057  NA 139  K 3.8  CL 103  CO2 26  BUN 21*  CREATININE 1.12  GLUCOSE 128*   No results for input(s): LABPT, INR in the last 72 hours.   Assessment/Plan: 1 Day Post-Op Procedure(s) (LRB): Irrigation and excisional debridement of right open fibula fracture including skin, subcutaneous tissue, muscle and bone,  Open treatment of right fibula fracture with internal fixation,   Repair of peroneus brevis tendon,  Repair of peroneus longus tendon,   Repair of superficial peroneal nerve 6.  Complex closure of right leg laceration 7 cm in length  (Right) OPEN REDUCTION INTERNAL FIXATION (ORIF) ANKLE FRACTURE (Right)  NWB R LE Placed order for new R LE SLS to be applied D/C home today after new splint is applied. Rx for oxycodone and 81 mg ASA po BID sent to patient's pharmacy. After searching [place] PMP Aware the patient is provided a Rx for oxycodone. Plan for 2 week outpatient post-op visit.   Alfredo Martinez PA-C [facility] Office:  [phone]

## 2020-08-15 NOTE — Discharge Summary (Signed)
Physician Discharge Summary  Patient ID: Ryan Hall MRN: [id number] DOB/AGE: Jul 23, 2000 20 y.o.  Admit date: 08/13/2020 Discharge date: 08/15/2020  Admission Diagnoses: Open R fibula fx; laceration of peroneal tendons, superficial peroneal nerve, and complex R LE wound; hx of L knee pain, swelling of R little finger, R wrist pain, R patellar tendonitis.  Discharge Diagnoses:  Active Problems:   Traumatic type I or II open fracture of shaft of tibia and fibula, right, initial encounter Same as above  Discharged Condition: stable  Hospital Course: Patient presented to [facility] ED on 08/13/20 after incidentally sustaining an axe wound to his R LE.  Plain films were obtained and the patient was found to have an open R fibula fx.  Dr. Toni Arthurs was consulted.  The patient was urgently taken to the OR for I&D of the wound, ORIF R fibula fx, peroneal tendon repair, repair of the superficial peroneal nerve, and complex wound closure.  The patient tolerated the procedure well without complication.  He was then admitted to the hospital for 24 hrs IV Ancef.  The patient worked well with therapy, and tolerated his stay well.  He is to be D/C'd home on 08/15/20.  Consults: PT  Significant Diagnostic Studies: radiology: X-Ray: for diagnostic purposes and to ensure satisfactory anatomic alignment during operative procedure.  Treatments: IV hydration, antibiotics: Ancef, analgesia: acetaminophen, Dilaudid and hydrocodone, anticoagulation: loveonox and surgery: as stated above.  Discharge Exam: Blood pressure (!) 103/49, pulse (!) 45, temperature (!) 97.4 F (36.3 C), temperature source Oral, resp. rate 16, height 5\' 7"  (1.702 m), weight 68 kg, SpO2 100 %. General: WDWN patient in NAD. Psych:  Appropriate mood and affect. Neuro:  A&O x 3, Moving all extremities, sensation intact to light touch HEENT:  EOMs intact Chest:  Even non-labored respirations Skin:  SLS C/D/I, no rashes or  lesions Extremities: warm/dry, no visible edema, erythema or echymosis.  No lymphadenopathy. Pulses: Popliteus 2+ MSK:  ROM: EHL/FHL intact, MMT: able to perform quad set   Disposition: Discharge disposition: 01-Home or Self Care       Discharge Instructions    Call MD / Call 911   Complete by: As directed    If you experience chest pain or shortness of breath, CALL 911 and be transported to the hospital emergency room.  If you develope a fever above 101 F, pus (white drainage) or increased drainage or redness at the wound, or calf pain, call your surgeon's office.   Constipation Prevention   Complete by: As directed    Drink plenty of fluids.  Prune juice may be helpful.  You may use a stool softener, such as Colace (over the counter) 100 mg twice a day.  Use MiraLax (over the counter) for constipation as needed.   Diet - low sodium heart healthy   Complete by: As directed    Increase activity slowly as tolerated   Complete by: As directed    Non weight bearing   Complete by: As directed    Laterality: right   Extremity: Lower   Post-operative opioid taper instructions:   Complete by: As directed    POST-OPERATIVE OPIOID TAPER INSTRUCTIONS: It is important to wean off of your opioid medication as soon as possible. If you do not need pain medication after your surgery it is ok to stop day one. Opioids include: Codeine, Hydrocodone(Norco, Vicodin), Oxycodone(Percocet, oxycontin) and hydromorphone amongst others.  Long term and even short term use of opiods can cause: Increased pain response  Dependence Constipation Depression Respiratory depression And more.  Withdrawal symptoms can include Flu like symptoms Nausea, vomiting And more Techniques to manage these symptoms Hydrate well Eat regular healthy meals Stay active Use relaxation techniques(deep breathing, meditating, yoga) Do Not substitute Alcohol to help with tapering If you have been on opioids for less than  two weeks and do not have pain than it is ok to stop all together.  Plan to wean off of opioids This plan should start within one week post op of your joint replacement. Maintain the same interval or time between taking each dose and first decrease the dose.  Cut the total daily intake of opioids by one tablet each day Next start to increase the time between doses. The last dose that should be eliminated is the evening dose.        Allergies as of 08/15/2020   No Known Allergies     Medication List    TAKE these medications   aspirin EC 81 MG tablet Take 1 tablet (81 mg total) by mouth 2 (two) times daily.   docusate sodium 100 MG capsule Commonly known as: Colace Take 1 capsule (100 mg total) by mouth 2 (two) times daily. While taking narcotic pain medicine.   oxyCODONE 5 MG immediate release tablet Commonly known as: Roxicodone Take 1 tablet (5 mg total) by mouth every 4 (four) hours as needed for up to 3 days for moderate pain or severe pain.   senna 8.6 MG Tabs tablet Commonly known as: SENOKOT Take 2 tablets (17.2 mg total) by mouth 2 (two) times daily.            Discharge Care Instructions  (From admission, onward)         Start     Ordered   08/15/20 0000  Non weight bearing       Question Answer Comment  Laterality right   Extremity Lower      08/15/20 0718          Follow-up Information    Toni Arthurs, MD. Schedule an appointment as soon as possible for a visit in 2 weeks.   Specialty: Orthopedic Surgery Contact information: [address] [place] [address] [phone]               Signed: Lolly Mustache Office:  [phone]

## 2020-08-16 ENCOUNTER — Encounter (HOSPITAL_COMMUNITY): Payer: Self-pay | Admitting: Orthopedic Surgery

## 2020-08-30 DIAGNOSIS — S81811A Laceration without foreign body, right lower leg, initial encounter: Secondary | ICD-10-CM | POA: Diagnosis not present

## 2020-09-20 DIAGNOSIS — S86921A Laceration of unspecified muscle(s) and tendon(s) at lower leg level, right leg, initial encounter: Secondary | ICD-10-CM | POA: Diagnosis not present

## 2020-10-05 DIAGNOSIS — M79604 Pain in right leg: Secondary | ICD-10-CM | POA: Diagnosis not present

## 2020-10-19 DIAGNOSIS — M25571 Pain in right ankle and joints of right foot: Secondary | ICD-10-CM | POA: Diagnosis not present

## 2022-02-04 DIAGNOSIS — S50812A Abrasion of left forearm, initial encounter: Secondary | ICD-10-CM | POA: Diagnosis not present

## 2022-02-04 DIAGNOSIS — S0341XA Sprain of jaw, right side, initial encounter: Secondary | ICD-10-CM | POA: Diagnosis not present

## 2022-02-04 DIAGNOSIS — S0081XA Abrasion of other part of head, initial encounter: Secondary | ICD-10-CM | POA: Diagnosis not present

## 2022-09-18 DIAGNOSIS — Z1331 Encounter for screening for depression: Secondary | ICD-10-CM | POA: Diagnosis not present

## 2022-09-18 DIAGNOSIS — J039 Acute tonsillitis, unspecified: Secondary | ICD-10-CM | POA: Diagnosis not present

## 2022-12-30 IMAGING — CT CT ANGIO EXTREM LOW*R*
2 of 5 series · 10 of 46 positions shown, 11 images · IV contrast (omnipaque)
Comparison: None.

CLINICAL DATA: Right lower extremity penetrating injury.

EXAM:
CT ANGIOGRAPHY OF THE RIGHT LOWEREXTREMITY
TECHNIQUE: Multidetector CT imaging of the right lowerwas performed using the
standard protocol during bolus administration of intravenous
contrast. Multiplanar CT image reconstructions and MIPs were
obtained to evaluate the vascular anatomy.
CONTRAST:  100mL OMNIPAQUE IOHEXOL 350 MG/ML SOLN

[Series 4: axial arterial · axial · arterial · 0.43mm/px · z∈[-663,-162]mm · 7 of 209 slices shown, 8 images]
[im 21/209  soft-tissue]
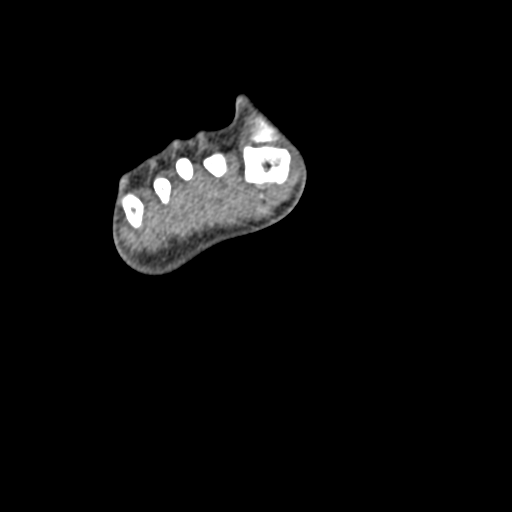
[im 21/209  bone]
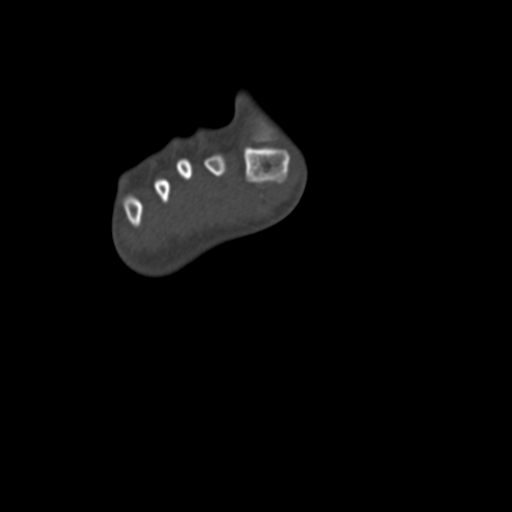
[im 47/209  soft-tissue]
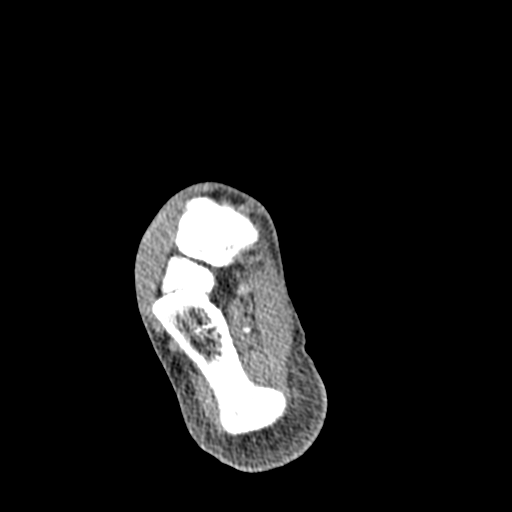
[im 74/209  soft-tissue]
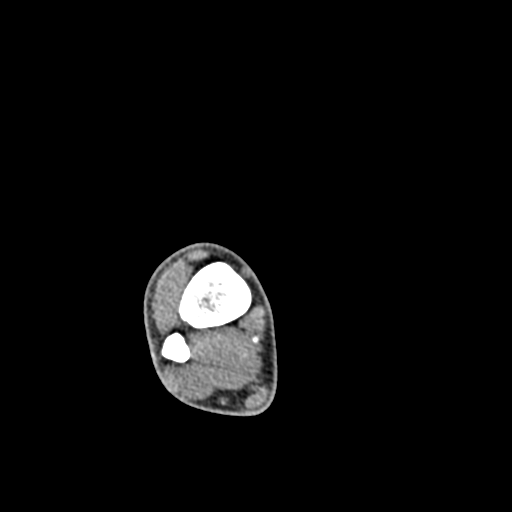
[im 108/209  soft-tissue]
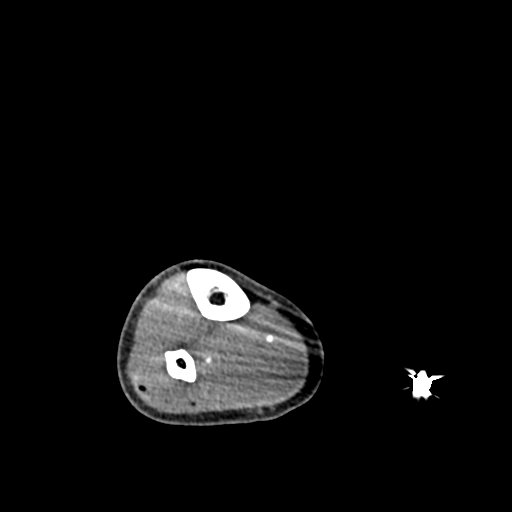
[im 135/209  soft-tissue]
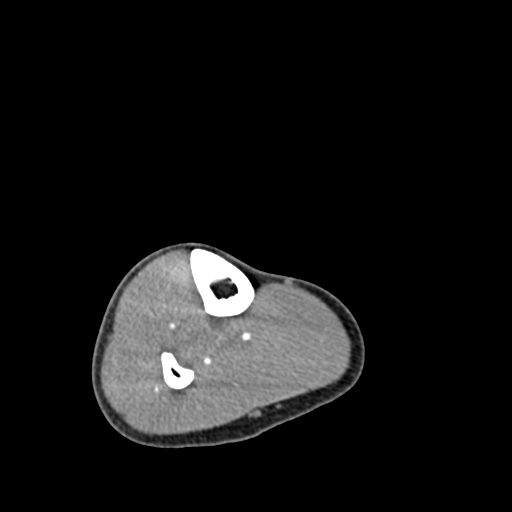
[im 162/209  soft-tissue]
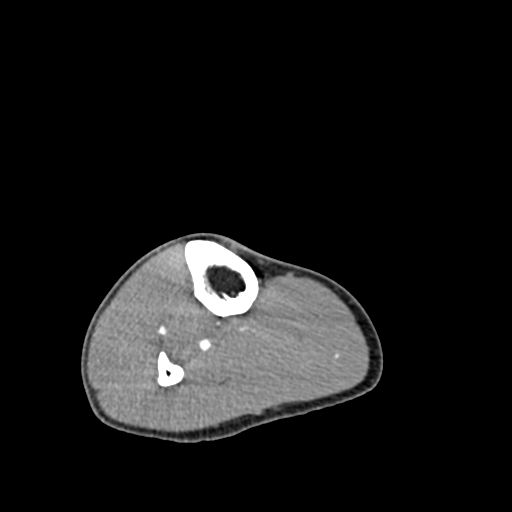
[im 188/209  soft-tissue]
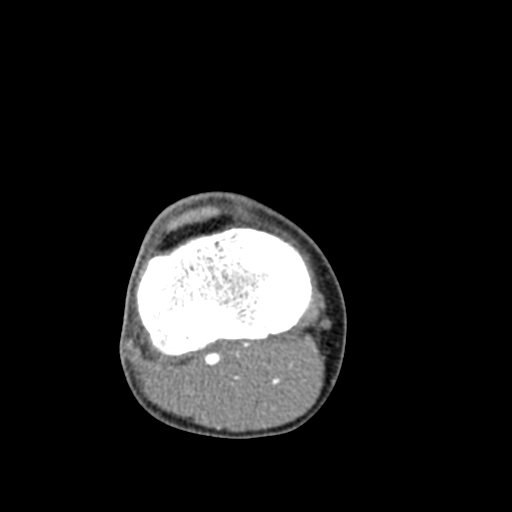

[Series 6: coronals · coronal · 0.36mm/px · 3 of 111 slices shown]
[im 28/111  soft-tissue]
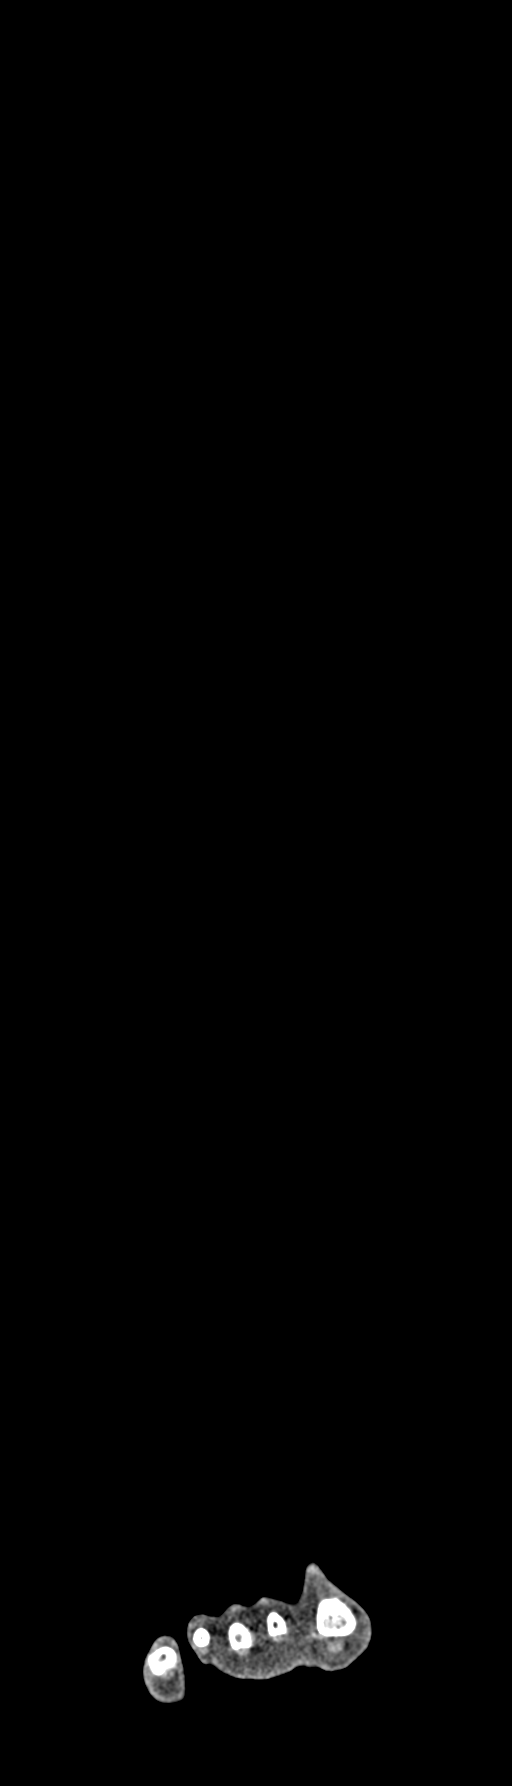
[im 56/111  soft-tissue]
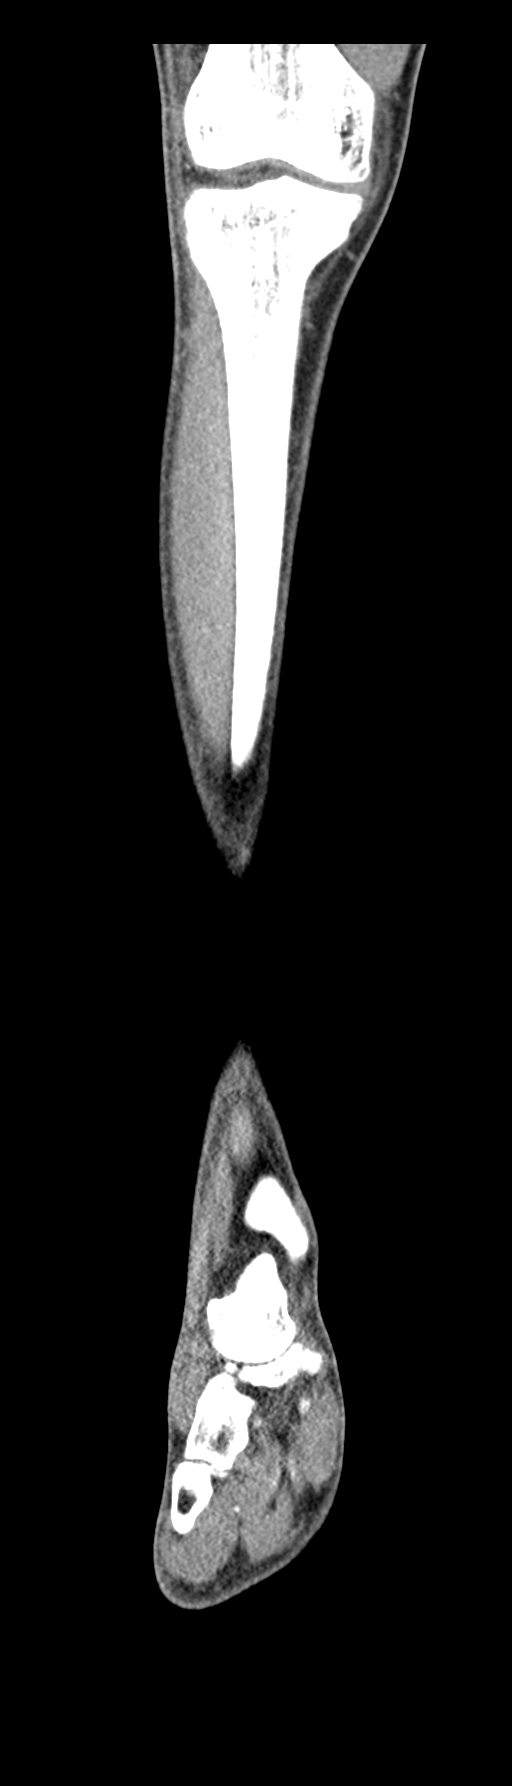
[im 83/111  soft-tissue]
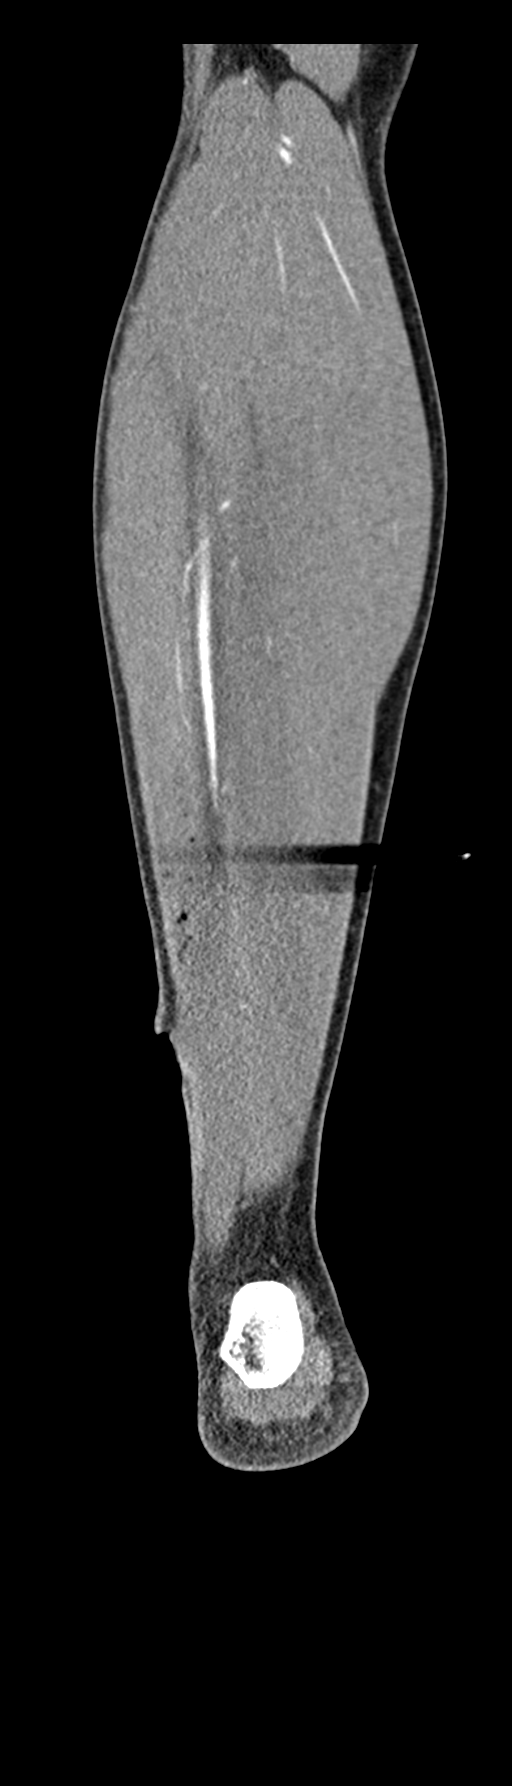

[10 of 46 positions shown; findings below may reference images not displayed]

FINDINGS: The visualized popliteal artery, proximal anterior tibial artery,
tibioperoneal trunk, peroneal artery, and posterior tibial arteries
are widely patent without evidence of hemodynamically significant
stenosis, aneurysm, or dissection. No significant atherosclerotic
plaque. The anterior tibial artery tapers gradually and terminates
at the level of the distal third of the diaphysis of the tibia. The
vessel, however, appears separate from the adjacent soft tissue
wound and this may simply relate to vasospasm related to the rate
[place] injury. Similarly, the peroneal artery at the level of the
injury is remote from the wound, but demonstrates gradual tapering
and ultimately terminates at the level of the tibial metaphysis
again, likely related to vasospasm. The posterior tibial artery
continues to form the plantar arch. No active extravasation
identified. No pseudoaneurysm or traumatic arteriovenous fistula.

A soft tissue defect is seen involving the lateral aspect of the
right foreleg at the level of the distal third of the diaphysis of
the tibia and fibula. There is a cortical fracture fragment
involving the lateral cortex of the distal fibula which communicates
with the wound in keeping with an open fracture. The fracture
fragment involves less than 50% of the a circumference of the
fibula. Subcutaneous gas is seen within the soft tissues surrounding
the wound.

Review of the MIP images confirms the above findings.
IMPRESSION: No definite traumatic vascular injury. Gradual occlusion of the
anterior tibial and to a lesser extent peroneal arteries likely
relates to vasospasm as both vessels are remote from the soft tissue
wound. No active extravasation, pseudoaneurysm, or traumatic AV
fistula.

Open fracture of the distal fibular diaphysis with small lateral
cortical fracture fragment identified. Fracture fragment comprises
less than 50% of the circumference of the fibula.

Large lateral soft tissue defect at the level of the distal third of
the fibular diaphysis communicating with the fracture plane at its
base.
